# Patient Record
Sex: Female | Born: 1955 | Race: White | Hispanic: No | Smoking: Current every day smoker
Health system: Southern US, Community
[De-identification: ages and names within clinical notes are randomized; demographics above are authoritative.]

## PROBLEM LIST (undated history)

## (undated) DIAGNOSIS — C3490 Malignant neoplasm of unspecified part of unspecified bronchus or lung: Secondary | ICD-10-CM

## (undated) HISTORY — DX: Malignant neoplasm of unspecified part of unspecified bronchus or lung: C34.90

---

## 2002-11-19 ENCOUNTER — Encounter: Admission: RE | Admit: 2002-11-19 | Discharge: 2002-11-19 | Payer: Self-pay

## 2002-11-19 ENCOUNTER — Encounter: Payer: Self-pay | Admitting: Unknown Physician Specialty

## 2003-11-24 ENCOUNTER — Ambulatory Visit (HOSPITAL_COMMUNITY): Admission: RE | Admit: 2003-11-24 | Discharge: 2003-11-24 | Payer: Self-pay | Admitting: Unknown Physician Specialty

## 2003-11-27 ENCOUNTER — Encounter: Admission: RE | Admit: 2003-11-27 | Discharge: 2003-11-27 | Payer: Self-pay | Admitting: Unknown Physician Specialty

## 2004-01-08 ENCOUNTER — Ambulatory Visit (HOSPITAL_COMMUNITY): Admission: RE | Admit: 2004-01-08 | Discharge: 2004-01-09 | Payer: Self-pay | Admitting: General Surgery

## 2004-01-08 ENCOUNTER — Encounter (INDEPENDENT_AMBULATORY_CARE_PROVIDER_SITE_OTHER): Payer: Self-pay | Admitting: *Deleted

## 2004-09-16 ENCOUNTER — Encounter: Admission: RE | Admit: 2004-09-16 | Discharge: 2004-09-16 | Payer: Self-pay | Admitting: Unknown Physician Specialty

## 2004-09-21 ENCOUNTER — Encounter: Admission: RE | Admit: 2004-09-21 | Discharge: 2004-09-21 | Payer: Self-pay | Admitting: Unknown Physician Specialty

## 2004-11-17 ENCOUNTER — Ambulatory Visit (HOSPITAL_BASED_OUTPATIENT_CLINIC_OR_DEPARTMENT_OTHER): Admission: RE | Admit: 2004-11-17 | Discharge: 2004-11-17 | Payer: Self-pay | Admitting: General Surgery

## 2004-11-17 ENCOUNTER — Ambulatory Visit (HOSPITAL_COMMUNITY): Admission: RE | Admit: 2004-11-17 | Discharge: 2004-11-17 | Payer: Self-pay | Admitting: General Surgery

## 2004-11-17 ENCOUNTER — Encounter (INDEPENDENT_AMBULATORY_CARE_PROVIDER_SITE_OTHER): Payer: Self-pay | Admitting: Specialist

## 2005-06-10 ENCOUNTER — Emergency Department (HOSPITAL_COMMUNITY): Admission: EM | Admit: 2005-06-10 | Discharge: 2005-06-10 | Payer: Self-pay | Admitting: *Deleted

## 2005-12-12 ENCOUNTER — Emergency Department (HOSPITAL_COMMUNITY): Admission: EM | Admit: 2005-12-12 | Discharge: 2005-12-12 | Payer: Self-pay | Admitting: Emergency Medicine

## 2005-12-20 ENCOUNTER — Ambulatory Visit: Payer: Self-pay

## 2005-12-23 ENCOUNTER — Ambulatory Visit: Payer: Self-pay | Admitting: Cardiology

## 2006-01-04 ENCOUNTER — Ambulatory Visit: Payer: Self-pay | Admitting: Cardiology

## 2006-01-04 ENCOUNTER — Encounter: Payer: Self-pay | Admitting: Cardiology

## 2006-01-04 ENCOUNTER — Ambulatory Visit: Payer: Self-pay

## 2006-01-18 ENCOUNTER — Ambulatory Visit: Payer: Self-pay | Admitting: Cardiology

## 2006-01-24 ENCOUNTER — Ambulatory Visit (HOSPITAL_COMMUNITY): Admission: RE | Admit: 2006-01-24 | Discharge: 2006-01-24 | Payer: Self-pay | Admitting: Gastroenterology

## 2006-01-26 ENCOUNTER — Ambulatory Visit (HOSPITAL_COMMUNITY): Admission: RE | Admit: 2006-01-26 | Discharge: 2006-01-26 | Payer: Self-pay | Admitting: Gastroenterology

## 2006-01-30 ENCOUNTER — Ambulatory Visit (HOSPITAL_COMMUNITY): Admission: RE | Admit: 2006-01-30 | Discharge: 2006-01-30 | Payer: Self-pay | Admitting: Gastroenterology

## 2006-01-30 ENCOUNTER — Encounter (INDEPENDENT_AMBULATORY_CARE_PROVIDER_SITE_OTHER): Payer: Self-pay | Admitting: Specialist

## 2006-02-17 ENCOUNTER — Encounter: Admission: RE | Admit: 2006-02-17 | Discharge: 2006-02-17 | Payer: Self-pay | Admitting: Unknown Physician Specialty

## 2007-05-05 ENCOUNTER — Emergency Department (HOSPITAL_COMMUNITY): Admission: EM | Admit: 2007-05-05 | Discharge: 2007-05-05 | Payer: Self-pay | Admitting: Emergency Medicine

## 2007-07-23 ENCOUNTER — Emergency Department (HOSPITAL_COMMUNITY): Admission: EM | Admit: 2007-07-23 | Discharge: 2007-07-23 | Payer: Self-pay | Admitting: Emergency Medicine

## 2008-02-15 ENCOUNTER — Emergency Department (HOSPITAL_COMMUNITY): Admission: EM | Admit: 2008-02-15 | Discharge: 2008-02-15 | Payer: Self-pay | Admitting: Family Medicine

## 2008-05-08 ENCOUNTER — Emergency Department (HOSPITAL_COMMUNITY): Admission: EM | Admit: 2008-05-08 | Discharge: 2008-05-08 | Payer: Self-pay | Admitting: Emergency Medicine

## 2008-05-14 ENCOUNTER — Encounter: Admission: RE | Admit: 2008-05-14 | Discharge: 2008-05-14 | Payer: Self-pay | Admitting: Unknown Physician Specialty

## 2008-11-17 ENCOUNTER — Ambulatory Visit (HOSPITAL_COMMUNITY): Admission: RE | Admit: 2008-11-17 | Discharge: 2008-11-17 | Payer: Self-pay | Admitting: Unknown Physician Specialty

## 2008-11-18 ENCOUNTER — Ambulatory Visit: Payer: Self-pay | Admitting: Oncology

## 2008-11-25 ENCOUNTER — Ambulatory Visit: Payer: Self-pay | Admitting: Radiation Oncology

## 2008-11-25 ENCOUNTER — Ambulatory Visit: Payer: Self-pay | Admitting: Oncology

## 2008-12-01 ENCOUNTER — Ambulatory Visit: Payer: Self-pay | Admitting: Oncology

## 2008-12-26 ENCOUNTER — Ambulatory Visit: Payer: Self-pay | Admitting: Oncology

## 2009-01-26 ENCOUNTER — Ambulatory Visit: Payer: Self-pay | Admitting: Oncology

## 2009-02-19 ENCOUNTER — Ambulatory Visit (HOSPITAL_BASED_OUTPATIENT_CLINIC_OR_DEPARTMENT_OTHER): Admission: RE | Admit: 2009-02-19 | Discharge: 2009-02-19 | Payer: Self-pay | Admitting: General Surgery

## 2009-02-23 ENCOUNTER — Ambulatory Visit: Payer: Self-pay | Admitting: Oncology

## 2009-03-26 ENCOUNTER — Ambulatory Visit: Payer: Self-pay | Admitting: Oncology

## 2009-04-25 ENCOUNTER — Ambulatory Visit: Payer: Self-pay | Admitting: Oncology

## 2009-05-26 ENCOUNTER — Ambulatory Visit: Payer: Self-pay | Admitting: Oncology

## 2009-06-25 ENCOUNTER — Ambulatory Visit: Payer: Self-pay | Admitting: Oncology

## 2009-07-10 ENCOUNTER — Ambulatory Visit: Payer: Self-pay | Admitting: Vascular Surgery

## 2009-07-26 ENCOUNTER — Ambulatory Visit: Payer: Self-pay | Admitting: Oncology

## 2009-08-26 ENCOUNTER — Ambulatory Visit: Payer: Self-pay | Admitting: Oncology

## 2009-09-25 ENCOUNTER — Ambulatory Visit: Payer: Self-pay | Admitting: Oncology

## 2009-10-26 ENCOUNTER — Ambulatory Visit: Payer: Self-pay | Admitting: Oncology

## 2009-11-25 ENCOUNTER — Ambulatory Visit: Payer: Self-pay | Admitting: Oncology

## 2009-12-26 ENCOUNTER — Ambulatory Visit: Payer: Self-pay | Admitting: Oncology

## 2010-01-26 ENCOUNTER — Ambulatory Visit: Payer: Self-pay | Admitting: Oncology

## 2010-02-23 ENCOUNTER — Ambulatory Visit: Payer: Self-pay | Admitting: Oncology

## 2010-03-26 ENCOUNTER — Ambulatory Visit: Payer: Self-pay | Admitting: Oncology

## 2010-04-25 ENCOUNTER — Ambulatory Visit: Payer: Self-pay | Admitting: Oncology

## 2010-04-27 ENCOUNTER — Ambulatory Visit: Payer: Self-pay | Admitting: Unknown Physician Specialty

## 2010-05-26 ENCOUNTER — Ambulatory Visit: Payer: Self-pay | Admitting: Oncology

## 2010-06-25 ENCOUNTER — Ambulatory Visit: Payer: Self-pay | Admitting: Oncology

## 2010-07-26 ENCOUNTER — Ambulatory Visit: Payer: Self-pay | Admitting: Oncology

## 2010-08-09 ENCOUNTER — Ambulatory Visit: Payer: Self-pay | Admitting: Ophthalmology

## 2010-08-17 ENCOUNTER — Ambulatory Visit: Payer: Self-pay | Admitting: Ophthalmology

## 2010-08-26 ENCOUNTER — Ambulatory Visit: Payer: Self-pay | Admitting: Oncology

## 2010-09-01 ENCOUNTER — Ambulatory Visit: Payer: Self-pay | Admitting: Oncology

## 2010-09-25 ENCOUNTER — Ambulatory Visit: Payer: Self-pay | Admitting: Oncology

## 2010-10-26 ENCOUNTER — Ambulatory Visit: Payer: Self-pay | Admitting: Oncology

## 2010-11-25 ENCOUNTER — Ambulatory Visit: Payer: Self-pay | Admitting: Oncology

## 2011-01-16 ENCOUNTER — Encounter: Payer: Self-pay | Admitting: Unknown Physician Specialty

## 2011-02-07 ENCOUNTER — Ambulatory Visit: Payer: Self-pay | Admitting: Oncology

## 2011-02-14 ENCOUNTER — Ambulatory Visit: Payer: Self-pay | Admitting: Oncology

## 2011-02-24 ENCOUNTER — Ambulatory Visit: Payer: Self-pay | Admitting: Oncology

## 2011-03-27 ENCOUNTER — Ambulatory Visit: Payer: Self-pay | Admitting: Oncology

## 2011-05-10 NOTE — Op Note (Signed)
Kristine Escobar, Kristine Escobar             ACCOUNT NO.:  [id number]   MEDICAL RECORD NO.:  [id number]          PATIENT TYPE:  AMB   LOCATION:  DSC                          FACILITY:  [facility]   PHYSICIAN:  Cherylynn Ridges, M.D.    DATE OF BIRTH:  October 01, 1956   DATE OF PROCEDURE:  02/19/2009  DATE OF DISCHARGE:                               OPERATIVE REPORT   PREOPERATIVE DIAGNOSIS:  Lung cancer.   POSTOPERATIVE DIAGNOSIS:  Lung cancer.   PROCEDURE:  Placement of right subclavian Port-A-Cath.   SURGEON:  Marta Lamas. Lindie Spruce, MD   ANESTHESIA:  Monitored anesthesia care with local.   ESTIMATED BLOOD LOSS:  20 mL.   COMPLICATIONS:  No complication.   CONDITION:  Stable.   FINDINGS:  Chest x-ray is pending.   INDICATIONS FOR OPERATION:  The patient recently diagnosed with  bilateral lung cancer who comes in for Port-A-Cath placement after she  is already started chemotherapy.   OPERATION DETAILS:  The patient was taken to the operating room and  placed on table in a supine position.  After an adequate amount of IV  sedation was given, a roll was placed between the shoulder blades.  She  was prepped and draped in the usual sterile manner.  We started on the  right side.  We anesthetized the subclavian area with Xylocaine with  epinephrine.  Multiple sticks were needed to access the subclavian vein  on the left side.  It was more medial one-third to the clavicle,  however, we accessed the vein.  We did hit the artery on 2 occasions,  but there was no significant hematoma formation.   Once we hit the vein, we used Seldinger technique to pass the wire into  the vein down towards hardware.  We confirmed its presence and the  venous system using fluoroscopy.  A port site was selected in the  anterior chest wall above the right breast where we made a pocket.  This  had to be revised several times because the patient had very significant  amount of adipose tissue.  We passed a catheter through an  introducer  and dilator into the venous system down towards the right atrium.  We  confirmed its position in the right atrium using fluoroscopy.  We left  it little bit long because of the patient's pendulous breasts.  In the  supine position or in the upright position they would pull the catheter  back.  We cut the catheter to the appropriate length.  This had to be  revised by taking off 2-3 cm after its initial placement because of  redundancy in the catheter in the tunnel.  Once it was revised, we  attached it to the pocket using 3-0 Vicryl sutures through the sites on  the port itself.  We flushed with heparinized saline solution prior to  implantation, then subsequently after we got it in place.  The port is  somewhat deep in the subcutaneous tissue, however, you can feel the 3  bumps on the PowerPort adequately.  It does aspirate easily and flush  easily after it has  been in place.  We closed the subcu with 3-0 Vicryl, then the skin  closed using a running subcuticular stitch of 5-0 Vicryl.  The entrance  site of the catheter at the subclavian area was also closed with a 5-0  Vicryl.  Dermabond, Steri-Strips, and Tegaderm were used to complete the  dressing.  A chest x-ray in the recovery room is pending.      Cherylynn Ridges, M.D.  Electronically Signed     JOW/MEDQ  D:  02/19/2009  T:  02/20/2009  Job:  [id number]

## 2011-05-13 NOTE — Op Note (Signed)
Kristine Escobar, Kristine Escobar             ACCOUNT NO.:  [id number]   MEDICAL RECORD NO.:  [id number]          PATIENT TYPE:  AMB   LOCATION:  ENDO                         FACILITY:  [facility]   PHYSICIAN:  Petra Kuba, M.D.    DATE OF BIRTH:  [birth year]   DATE OF PROCEDURE:  01/30/2006  DATE OF DISCHARGE:                                 OPERATIVE REPORT   PROCEDURE:  Colonoscopy with biopsy and polypectomy.   INDICATIONS:  A patient with right upper quadrant pain due for colonic  screening. Family history of colon polyps. Consent was signed after risks,  benefits, methods, and options were thoroughly discussed in the office.   MEDICINES USED:  Fentanyl 75 mcg, Versed 8 mg.   DESCRIPTION OF PROCEDURE:  Rectal inspection is pertinent for external  hemorrhoids, small. Digital exam was negative. The video colonoscope was  inserted, easily advanced to the hepatic flexure. At that point, there was  some looping and with abdominal pressure was able to be advanced to the  cecum. On insertion some sigmoid hyperplastic appearing polyps were seen and  a rare left-sided diverticula. The cecum was identified by the appendiceal  orifice and the ileocecal valve. In fact, the scope was inserted a short  stay into the terminal ileum which was normal, photo documentation was  obtained. The scope was slowly withdrawn. The cecum, ascending and  transverse were normal as was the majority of the descending. In the distal  descending and sigmoid, a rare tiny early diverticula were seen. She also  had beginning at this junction a few left-sided hyperplastic appearing  polyps which were cold biopsied. In the more distal sigmoid, three slightly  larger polyps were seen, again appeared hyperplastic, but we went ahead and  snared them, electrocautery was applied and we suctioned them through the  scope and collected them in the trap and put them in a separate container.  Once back in the rectum, anorectal pull-through  and retroflexion confirmed  some small hemorrhoids, no other abnormalities were seen. The scope was  straightened and readvanced a short ways up the left side of the colon, air  was suctioned, scope removed. The patient tolerated the procedure well.  There was no obvious or immediate complication.   ENDOSCOPIC DIAGNOSES:  1.  Internal and external small hemorrhoids.  2.  Rare left-sided early diverticula.  3.  Multiple left-sided hyperplastic appearing polyps cold biopsied.  4.  Three rectal and distal sigmoid small polyps snared and put in a      separate container.  5.  Otherwise within normal limits to the cecum and the terminal.   PLAN:  Await pathology to determine future colonic screening. Continue  workup with an EGD.           ______________________________  Petra Kuba, M.D.     MEM/MEDQ  D:  01/30/2006  T:  01/30/2006  Job:  [id number]

## 2011-05-13 NOTE — Op Note (Signed)
Kristine Escobar, [name]             ACCOUNT NO.:  [id number]   MEDICAL RECORD NO.:  [id number]          PATIENT TYPE:  AMB   LOCATION:  ENDO                         FACILITY:  [facility]   PHYSICIAN:  Petra Kuba, M.D.    DATE OF BIRTH:  [birth year]   DATE OF PROCEDURE:  01/30/2006  DATE OF DISCHARGE:                                 OPERATIVE REPORT   PROCEDURE:  Esophagogastroduodenoscopy.   INDICATIONS:  Right upper quadrant pain, nausea and vomiting, nondiagnostic  colonoscopy and CAT scan.   Consent was signed after risks, benefits, methods, options thoroughly  discussed in the office.   Additional medicines for this procedure:  Fentanyl 25 mcg, Versed 2 mg.   PROCEDURE:  The video endoscope was inserted by direct vision.  The  esophagus was normal.  She did have a small to medium sized hiatal hernia.  The scope passed into the stomach.  She had some difficulty holding air, but  we were able to advance through a normal pylorus into a normal duodenal  bulb, around the C loop to a normal second portion of the duodenum.  The  scope was withdrawn back into the bulb, and a good look there ruled out  ulcers in all locations.  The scope was withdrawn back to the stomach.  She  did have a minimal amount of antritis.  The stomach was evaluated on  straight and retroflexed visualization.  She had some difficulty holding  air, which made complete evaluation difficult but on quick evaluation, the  cardia, fundus, angularis, lesser and greater curves were all normal except  for the hiatal hernia being confirmed in the cardia.  The scope was slowly  withdrawn, again, the esophagus was normal, except for the hiatal hernia.  No additional findings were seen.  The scope was removed.  The patient  tolerated the procedure fairly well.  There was no obvious immediate  complication.   ENDOSCOPIC DIAGNOSES:  1.  Small to medium sized hiatal hernia.  2.  Minimal antritis.  3.  Otherwise normal  esophagogastroduodenoscopy with some difficulty holding      air, making complete visualization of the stomach difficult.   PLAN:  Will upgrade her acid blockers to pump inhibitors.  Consider MRCP or  a small bowel follow-through, possibly even a laparoscope and lysis of  adhesions.  Will touch base with the biopsy, see how her pain is doing, and  decide how to proceed.           ______________________________  Petra Kuba, M.D.     MEM/MEDQ  D:  01/30/2006  T:  01/30/2006  Job:  [id number]

## 2011-05-13 NOTE — Op Note (Signed)
NAMEGEORGIAN, Kristine Escobar                       ACCOUNT NO.:  [id number]   MEDICAL RECORD NO.:  [id number]                   PATIENT TYPE:  OIB   LOCATION:  6703                                 FACILITY:  [facility]   PHYSICIAN:  Leonie Man, M.D.                DATE OF BIRTH:  Jan 29, 1956   DATE OF PROCEDURE:  01/08/2004  DATE OF DISCHARGE:                                 OPERATIVE REPORT   PREOPERATIVE DIAGNOSIS:  Chronic calculus cholecystitis.   POSTOPERATIVE DIAGNOSIS:  Chronic calculus cholecystitis.   PROCEDURE:  Laparoscopic cholecystectomy with intraoperative cholangiogram.   SURGEON:  Leonie Man, M.D.   ASSISTANT:  Anselm Pancoast. Zachery Dakins, M.D.   ANESTHESIA:  General.   INDICATIONS FOR PROCEDURE:  This patient is a 55 year old woman presenting  with right-sided abdominal pain associated with nausea and vomiting, dating  back to approximately a month ago.  Evaluated and noted on abdominal  ultrasound to have a contracted gallbladder packed with stones.  There was  no associated ductal dilatation.  Normal liver function.  She comes to the  operating room now after the risks and potential benefits of surgery have  been fully discussed and she gives consent.   DESCRIPTION OF PROCEDURE:  Following the induction of satisfactory general  anesthesia, the patient is positioned supinely and the abdomen is prepped  and draped routinely.  Open laparoscopy created at the umbilicus and the  insertion of the Hasson cannula and insufflation of peritoneal cavity to 14  mmHg pressure.  The camera is inserted with visualization of the entire  abdomen was carried out.  The anterior gastric wall was noted.  There was  some tethering of the duodenum up to the ampulla of the gallbladder.  The  gallbladder was chronically scarred and contracted.  None of the large or  small intestines viewed appeared to be abnormal.  Under direct vision,  epigastric and lateral ports were placed.  The  gallbladder was grasped and  retracted cephalad and dissection carried down to the region of the ampulla  of the gallbladder taking down multiple adhesions below the gallbladder.  The cystic duct and cystic artery were then isolated and traced up to their  entry into the gallbladder wall and the gallbladder.  The cystic artery was  doubly clipped and transected.  The cystic duct clipped proximally and  opened.  I used a Cook catheter, passing it through the abdominal wall into  the abdomen, placed it in the cystic duct, and injected 1/2 strength Hypaque  dye into the biliary system under fluoroscopic control.  The resulting  cholangiogram showed prompt flow of contrast into the duodenum with normal  distal common bile duct tapering, no filling defects within the common bile  duct or the upper radicals which were visualized well.  The cystic duct  catheter was then removed and the cystic duct doubly clipped and transected.  The gallbladder was then dissected free from  the liver bed using  electrocautery and maintaining hemostasis throughout the entire course of  the dissection.  The liver bed was then checked for hemostasis and  additional bleeding points treated with electrocautery.  The gallbladder was  placed in an Endopouch and with the camera in the epigastric port, the  gallbladder was retrieved through the umbilical port without difficulty.  The right upper quadrant was thoroughly irrigated with normal saline and  aspirated.  The trocars were removed under direct vision and the incisions  closed in layers as follows after the pneumoperitoneum was completely  evacuated.   The umbilical wound in two layers with 0 Vicryl and 4-0 Monocryl.  The  epigastric and lateral flank wounds closed with 4-0 Monocryl suture.  All  wounds were reinforced with Steri-Strips.  Sterile dressings applied.  Anesthetic reversed.  The patient removed from the operating room to the  recovery room in stable  condition.  She tolerated the procedure well.                                               Leonie Man, M.D.    PB/MEDQ  D:  01/08/2004  T:  01/08/2004  Job:  [id number]

## 2011-05-13 NOTE — Op Note (Signed)
Kristine Escobar, Kristine Escobar             ACCOUNT NO.:  [id number]   MEDICAL RECORD NO.:  [id number]          PATIENT TYPE:  AMB   LOCATION:  DSC                          FACILITY:  [facility]   PHYSICIAN:  Leonie Man, M.D.   DATE OF BIRTH:  May 16, 1956   DATE OF PROCEDURE:  11/17/2004  DATE OF DISCHARGE:                                 OPERATIVE REPORT   PREOPERATIVE DIAGNOSIS:  1.  Mass right upper outer quadrant of the breast.  2.  Sebaceous cyst nipple right breast.   POSTOPERATIVE DIAGNOSIS:  1.  Mass (lipoma) right breast upper outer quadrant.  2.  Sebaceous cyst nipple right breast.   PROCEDURE:  Excision of mass of the right breast in the right upper outer  quadrant and excision of sebaceous cyst of the nipple of the right breast.   SURGEON:  Leonie Man, M.D.   ASSISTANT:  Nurse.   ANESTHESIA:  General.   INDICATIONS FOR PROCEDURE:  The patient is a 54 year old female with  recurrent infections in the nipple of the breast.  These have been treated  with antibiotics and now under control to the point where she may have it  excised.  On further examination of her breast, there is a mass in the upper  outer quadrant of the breast which is technically a fibroadenoma.  The  patient comes to the operating room after the risks and potential benefits  of surgery have been discussed, all questions answered, and consent  obtained.   DESCRIPTION OF PROCEDURE:  Following the induction of satisfactory general  anesthesia, the patient is positioned supinely.  The right breast prepped  and draped to be included in a sterile operative field.  The upper outer  quadrant mass is first approached with a paracircumareolar incision deepened  through the skin and subcutaneous tissue, and carried down to the mass.  The  mass was excised in its entirety.  It is clinically a lipoma.  Hemostasis  was assured with electrocautery.  The subcutaneous tissue is closed with  interrupted 3-0 Vicryl  sutures and the skin closed with a 5-0 Monocryl  suture.   Attention was then turned to the region of the nipple where in the 6 o'clock  axis of the nipple there is a mass which is consistent with a healed  epidermoid cyst.  This is encircled with an elliptical incision, deepened  through the skin and subcutaneous tissue, getting all the way onto the cyst  and removing it in its entirety.  The nipple is reconstructed using  interrupted 3-0 Vicryl sutures in the subcutaneous tissues and a running 5-0  Monocryl suture to close the skin.  Needle, sponge, and instrument counts correct.  The wounds were reinforced  with Steri-Strips.  Sterile dressings applied.  Anesthetic reversed.  The  patient removed from the operating room to the recovery room in stable  condition.  She tolerated the procedure well.      Patr   PB/MEDQ  D:  11/17/2004  T:  11/17/2004  Job:  [id number]

## 2011-05-13 NOTE — Discharge Summary (Signed)
Kristine Escobar, Kristine Escobar                       ACCOUNT NO.:  [id number]   MEDICAL RECORD NO.:  [id number]                   PATIENT TYPE:  OIB   LOCATION:  6703                                 FACILITY:  [facility]   PHYSICIAN:  Leonie Man, M.D.                DATE OF BIRTH:  May 13, 1956   DATE OF ADMISSION:  01/08/2004  DATE OF DISCHARGE:  01/09/2004                                 DISCHARGE SUMMARY   ADMISSION DIAGNOSIS:  Chronic calculus cholecystitis.   DISCHARGE DIAGNOSIS:  Chronic calculus cholecystitis.   HOSPITAL PROCEDURES:  Laparoscopic cholecystectomy with intraoperative  cholangiogram.   COMPLICATIONS:  None.   CONDITION ON DISCHARGE:  Improved.   HOME MEDICATIONS:  Vicodin 5/500 1-2 q.4h. p.r.n. pain.   ACTIVITY:  As tolerated.   DIET:  Unrestricted.   Patient is advised to continue her usual medications at home, which include  Accupril 40 mg daily, Norvasc 10 mg daily, Avandia 4 mg daily, Zoloft 50 mg  daily.   She is also instructed to stop smoking, as her chest x-rays showed some  incipient pulmonary disease.   Followup will be in the office in approximately two weeks.                                                Leonie Man, M.D.    PB/MEDQ  D:  01/09/2004  T:  01/09/2004  Job:  [id number]

## 2011-05-16 ENCOUNTER — Ambulatory Visit: Payer: Self-pay | Admitting: Oncology

## 2011-05-27 ENCOUNTER — Ambulatory Visit: Payer: Self-pay | Admitting: Oncology

## 2011-06-26 ENCOUNTER — Ambulatory Visit: Payer: Self-pay | Admitting: Oncology

## 2011-09-08 ENCOUNTER — Ambulatory Visit: Payer: Self-pay | Admitting: Oncology

## 2011-09-13 ENCOUNTER — Ambulatory Visit: Payer: Self-pay | Admitting: Oncology

## 2011-09-22 ENCOUNTER — Ambulatory Visit: Payer: Self-pay | Admitting: Unknown Physician Specialty

## 2011-09-26 ENCOUNTER — Ambulatory Visit: Payer: Self-pay | Admitting: Oncology

## 2011-10-27 ENCOUNTER — Ambulatory Visit: Payer: Self-pay | Admitting: Oncology

## 2011-11-22 ENCOUNTER — Ambulatory Visit: Payer: Self-pay | Admitting: Oncology

## 2011-11-26 ENCOUNTER — Ambulatory Visit: Payer: Self-pay | Admitting: Oncology

## 2011-12-27 ENCOUNTER — Ambulatory Visit: Payer: Self-pay | Admitting: Oncology

## 2011-12-29 LAB — CBC CANCER CENTER
Basophil #: 0.3 x10 3/mm — ABNORMAL HIGH (ref 0.0–0.1)
Eosinophil #: 0.1 x10 3/mm (ref 0.0–0.7)
Eosinophil %: 1 %
HCT: 42.5 % (ref 35.0–47.0)
Lymphocyte #: 2 x10 3/mm (ref 1.0–3.6)
Lymphocyte %: 29 %
MCH: 34 pg (ref 26.0–34.0)
MCHC: 33.5 g/dL (ref 32.0–36.0)
MCV: 102 fL — ABNORMAL HIGH (ref 80–100)
Monocyte #: 0.7 x10 3/mm (ref 0.0–0.7)
Neutrophil #: 3.8 x10 3/mm (ref 1.4–6.5)
Platelet: 182 x10 3/mm (ref 150–440)
RDW: 20.8 % — ABNORMAL HIGH (ref 11.5–14.5)

## 2011-12-29 LAB — COMPREHENSIVE METABOLIC PANEL
Alkaline Phosphatase: 95 U/L (ref 50–136)
Bilirubin,Total: 0.3 mg/dL (ref 0.2–1.0)
Calcium, Total: 9.3 mg/dL (ref 8.5–10.1)
Chloride: 98 mmol/L (ref 98–107)
Co2: 36 mmol/L — ABNORMAL HIGH (ref 21–32)
Creatinine: 2.16 mg/dL — ABNORMAL HIGH (ref 0.60–1.30)
EGFR (African American): 30 — ABNORMAL LOW
Glucose: 158 mg/dL — ABNORMAL HIGH (ref 65–99)
SGOT(AST): 23 U/L (ref 15–37)
SGPT (ALT): 18 U/L
Total Protein: 7.1 g/dL (ref 6.4–8.2)

## 2012-01-05 LAB — HEMOGLOBIN A1C: Hemoglobin A1C: 7 % — ABNORMAL HIGH (ref 4.2–6.3)

## 2012-01-05 LAB — CBC CANCER CENTER
Basophil #: 0 x10 3/mm (ref 0.0–0.1)
Eosinophil #: 0.1 x10 3/mm (ref 0.0–0.7)
Eosinophil %: 1 %
HGB: 15.5 g/dL (ref 12.0–16.0)
Lymphocyte #: 1.7 x10 3/mm (ref 1.0–3.6)
Lymphocyte %: 24.4 %
MCHC: 33.6 g/dL (ref 32.0–36.0)
MCV: 101 fL — ABNORMAL HIGH (ref 80–100)
Monocyte #: 0.6 x10 3/mm (ref 0.0–0.7)
Monocyte %: 9.2 %
Neutrophil %: 64.8 %
Platelet: 243 x10 3/mm (ref 150–440)
RBC: 4.6 10*6/uL (ref 3.80–5.20)
RDW: 19.6 % — ABNORMAL HIGH (ref 11.5–14.5)
WBC: 6.8 x10 3/mm (ref 3.6–11.0)

## 2012-01-12 LAB — CBC CANCER CENTER
Basophil #: 0.1 x10 3/mm (ref 0.0–0.1)
Eosinophil #: 0 x10 3/mm (ref 0.0–0.7)
Eosinophil %: 0.5 %
HGB: 15.4 g/dL (ref 12.0–16.0)
Lymphocyte #: 2.3 x10 3/mm (ref 1.0–3.6)
MCH: 33.8 pg (ref 26.0–34.0)
MCHC: 33.8 g/dL (ref 32.0–36.0)
Monocyte #: 0.7 x10 3/mm (ref 0.0–0.7)
Neutrophil %: 65.6 %
Platelet: 225 x10 3/mm (ref 150–440)
RDW: 17.9 % — ABNORMAL HIGH (ref 11.5–14.5)

## 2012-01-12 LAB — COMPREHENSIVE METABOLIC PANEL
BUN: 32 mg/dL — ABNORMAL HIGH (ref 7–18)
Bilirubin,Total: 0.4 mg/dL (ref 0.2–1.0)
Calcium, Total: 9.6 mg/dL (ref 8.5–10.1)
Co2: 32 mmol/L (ref 21–32)
Creatinine: 1.89 mg/dL — ABNORMAL HIGH (ref 0.60–1.30)
EGFR (African American): 35 — ABNORMAL LOW
EGFR (Non-African Amer.): 29 — ABNORMAL LOW
Glucose: 121 mg/dL — ABNORMAL HIGH (ref 65–99)
Osmolality: 284 (ref 275–301)
Potassium: 4.4 mmol/L (ref 3.5–5.1)
Sodium: 138 mmol/L (ref 136–145)

## 2012-01-19 LAB — CBC CANCER CENTER
Basophil #: 0 x10 3/mm (ref 0.0–0.1)
Basophil %: 0.4 %
Eosinophil #: 0.1 x10 3/mm (ref 0.0–0.7)
Eosinophil %: 1.3 %
HCT: 45.9 % (ref 35.0–47.0)
Lymphocyte %: 28.2 %
MCH: 33.9 pg (ref 26.0–34.0)
MCHC: 34 g/dL (ref 32.0–36.0)
MCV: 100 fL (ref 80–100)
Monocyte #: 0.6 x10 3/mm (ref 0.0–0.7)
Monocyte %: 7.7 %
Neutrophil #: 5.2 x10 3/mm (ref 1.4–6.5)
Neutrophil %: 62.4 %
Platelet: 204 x10 3/mm (ref 150–440)
RDW: 17.9 % — ABNORMAL HIGH (ref 11.5–14.5)

## 2012-01-20 ENCOUNTER — Ambulatory Visit: Payer: Self-pay | Admitting: Oncology

## 2012-01-26 LAB — CBC CANCER CENTER
Eosinophil #: 0.1 x10 3/mm (ref 0.0–0.7)
Eosinophil %: 1.3 %
Lymphocyte %: 21.7 %
MCH: 33.8 pg (ref 26.0–34.0)
Monocyte #: 0.6 x10 3/mm (ref 0.0–0.7)
Neutrophil %: 69.2 %
Platelet: 203 x10 3/mm (ref 150–440)
RBC: 4.65 10*6/uL (ref 3.80–5.20)
WBC: 8.3 x10 3/mm (ref 3.6–11.0)

## 2012-01-27 ENCOUNTER — Ambulatory Visit: Payer: Self-pay | Admitting: Oncology

## 2012-02-23 ENCOUNTER — Inpatient Hospital Stay: Payer: Self-pay | Admitting: Oncology

## 2012-02-23 LAB — CBC WITH DIFFERENTIAL/PLATELET
Basophil %: 1 %
Eosinophil #: 0 10*3/uL (ref 0.0–0.7)
Eosinophil %: 0.4 %
HCT: 44.9 % (ref 35.0–47.0)
HGB: 15 g/dL (ref 12.0–16.0)
Lymphocyte #: 1.4 10*3/uL (ref 1.0–3.6)
MCH: 32.1 pg (ref 26.0–34.0)
MCHC: 33.3 g/dL (ref 32.0–36.0)
MCV: 96 fL (ref 80–100)
Monocyte #: 0.9 10*3/uL — ABNORMAL HIGH (ref 0.0–0.7)
Neutrophil #: 4.5 10*3/uL (ref 1.4–6.5)
Neutrophil %: 65.9 %
RBC: 4.66 10*6/uL (ref 3.80–5.20)

## 2012-02-23 LAB — COMPREHENSIVE METABOLIC PANEL
Albumin: 3.1 g/dL — ABNORMAL LOW (ref 3.4–5.0)
Alkaline Phosphatase: 78 U/L (ref 50–136)
Bilirubin,Total: 0.4 mg/dL (ref 0.2–1.0)
Chloride: 96 mmol/L — ABNORMAL LOW (ref 98–107)
Creatinine: 1.54 mg/dL — ABNORMAL HIGH (ref 0.60–1.30)
EGFR (African American): 45 — ABNORMAL LOW
EGFR (Non-African Amer.): 37 — ABNORMAL LOW
Osmolality: 282 (ref 275–301)
SGOT(AST): 20 U/L (ref 15–37)
SGPT (ALT): 17 U/L
Total Protein: 7.6 g/dL (ref 6.4–8.2)

## 2012-02-24 ENCOUNTER — Ambulatory Visit: Payer: Self-pay | Admitting: Oncology

## 2012-02-24 LAB — CBC WITH DIFFERENTIAL/PLATELET
Basophil %: 0.2 %
Eosinophil %: 0 %
HCT: 47.6 % — ABNORMAL HIGH (ref 35.0–47.0)
HGB: 15.9 g/dL (ref 12.0–16.0)
Lymphocyte %: 18.5 %
MCH: 32.1 pg (ref 26.0–34.0)
Monocyte #: 0.1 10*3/uL (ref 0.0–0.7)
Monocyte %: 3.2 %
Neutrophil %: 78.1 %
Platelet: 143 10*3/uL — ABNORMAL LOW (ref 150–440)
RBC: 4.95 10*6/uL (ref 3.80–5.20)
WBC: 3.1 10*3/uL — ABNORMAL LOW (ref 3.6–11.0)

## 2012-02-24 LAB — COMPREHENSIVE METABOLIC PANEL
Albumin: 3.1 g/dL — ABNORMAL LOW (ref 3.4–5.0)
Alkaline Phosphatase: 83 U/L (ref 50–136)
Anion Gap: 10 (ref 7–16)
Calcium, Total: 9.5 mg/dL (ref 8.5–10.1)
Co2: 29 mmol/L (ref 21–32)
Osmolality: 285 (ref 275–301)
Potassium: 4.2 mmol/L (ref 3.5–5.1)
SGPT (ALT): 17 U/L
Sodium: 137 mmol/L (ref 136–145)
Total Protein: 8 g/dL (ref 6.4–8.2)

## 2012-02-27 LAB — BASIC METABOLIC PANEL
Calcium, Total: 9.5 mg/dL (ref 8.5–10.1)
Chloride: 98 mmol/L (ref 98–107)
Co2: 34 mmol/L — ABNORMAL HIGH (ref 21–32)
EGFR (Non-African Amer.): 46 — ABNORMAL LOW
Glucose: 110 mg/dL — ABNORMAL HIGH (ref 65–99)
Osmolality: 288 (ref 275–301)
Potassium: 4.6 mmol/L (ref 3.5–5.1)
Sodium: 140 mmol/L (ref 136–145)

## 2012-03-26 ENCOUNTER — Ambulatory Visit: Payer: Self-pay | Admitting: Oncology

## 2012-04-05 LAB — CBC CANCER CENTER
Basophil #: 0 x10 3/mm (ref 0.0–0.1)
Eosinophil #: 0.1 x10 3/mm (ref 0.0–0.7)
Eosinophil %: 1.4 %
HCT: 40.3 % (ref 35.0–47.0)
HGB: 13.5 g/dL (ref 12.0–16.0)
Lymphocyte %: 25.7 %
MCH: 30.9 pg (ref 26.0–34.0)
MCHC: 33.6 g/dL (ref 32.0–36.0)
MCV: 92 fL (ref 80–100)
Platelet: 158 x10 3/mm (ref 150–440)
WBC: 6.7 x10 3/mm (ref 3.6–11.0)

## 2012-04-05 LAB — COMPREHENSIVE METABOLIC PANEL
Anion Gap: 7 (ref 7–16)
BUN: 21 mg/dL — ABNORMAL HIGH (ref 7–18)
Bilirubin,Total: 0.3 mg/dL (ref 0.2–1.0)
Calcium, Total: 9.3 mg/dL (ref 8.5–10.1)
Co2: 31 mmol/L (ref 21–32)
EGFR (African American): 51 — ABNORMAL LOW
EGFR (Non-African Amer.): 44 — ABNORMAL LOW
Glucose: 162 mg/dL — ABNORMAL HIGH (ref 65–99)
Osmolality: 282 (ref 275–301)
Potassium: 4.1 mmol/L (ref 3.5–5.1)
Sodium: 138 mmol/L (ref 136–145)

## 2012-04-25 ENCOUNTER — Ambulatory Visit: Payer: Self-pay | Admitting: Oncology

## 2012-05-26 ENCOUNTER — Ambulatory Visit: Payer: Self-pay | Admitting: Oncology

## 2012-08-08 ENCOUNTER — Inpatient Hospital Stay: Payer: Self-pay | Admitting: Internal Medicine

## 2012-08-08 LAB — CBC
HCT: 47 % (ref 35.0–47.0)
HGB: 16.1 g/dL — ABNORMAL HIGH (ref 12.0–16.0)
MCH: 32.1 pg (ref 26.0–34.0)
MCHC: 34.2 g/dL (ref 32.0–36.0)
MCV: 94 fL (ref 80–100)
RDW: 15.7 % — ABNORMAL HIGH (ref 11.5–14.5)

## 2012-08-08 LAB — COMPREHENSIVE METABOLIC PANEL
Bilirubin,Total: 0.5 mg/dL (ref 0.2–1.0)
Calcium, Total: 9.2 mg/dL (ref 8.5–10.1)
Chloride: 97 mmol/L — ABNORMAL LOW (ref 98–107)
Co2: 32 mmol/L (ref 21–32)
Creatinine: 1.59 mg/dL — ABNORMAL HIGH (ref 0.60–1.30)
EGFR (African American): 42 — ABNORMAL LOW
EGFR (Non-African Amer.): 36 — ABNORMAL LOW
Sodium: 134 mmol/L — ABNORMAL LOW (ref 136–145)

## 2012-08-08 LAB — URINALYSIS, COMPLETE
Bilirubin,UR: NEGATIVE
Blood: NEGATIVE
Glucose,UR: NEGATIVE mg/dL (ref 0–75)
Specific Gravity: 1.024 (ref 1.003–1.030)
Squamous Epithelial: 13

## 2012-08-08 LAB — TROPONIN I: Troponin-I: <0.02 ng/mL

## 2012-08-13 LAB — CULTURE, BLOOD (SINGLE)

## 2012-08-14 ENCOUNTER — Ambulatory Visit: Payer: Self-pay | Admitting: Oncology

## 2012-09-03 ENCOUNTER — Ambulatory Visit: Payer: Self-pay | Admitting: Oncology

## 2012-09-03 LAB — COMPREHENSIVE METABOLIC PANEL WITH GFR
Albumin: 3.1 g/dL — ABNORMAL LOW (ref 3.4–5.0)
Alkaline Phosphatase: 97 U/L (ref 50–136)
Anion Gap: 2 — ABNORMAL LOW (ref 7–16)
BUN: 18 mg/dL (ref 7–18)
Bilirubin,Total: 0.4 mg/dL (ref 0.2–1.0)
Calcium, Total: 9.1 mg/dL (ref 8.5–10.1)
Chloride: 99 mmol/L (ref 98–107)
Co2: 37 mmol/L — ABNORMAL HIGH (ref 21–32)
Creatinine: 1.61 mg/dL — ABNORMAL HIGH (ref 0.60–1.30)
EGFR (African American): 41 — ABNORMAL LOW
EGFR (Non-African Amer.): 35 — ABNORMAL LOW
Glucose: 193 mg/dL — ABNORMAL HIGH (ref 65–99)
Osmolality: 283 (ref 275–301)
Potassium: 4 mmol/L (ref 3.5–5.1)
SGOT(AST): 13 U/L — ABNORMAL LOW (ref 15–37)
SGPT (ALT): 20 U/L (ref 12–78)
Sodium: 138 mmol/L (ref 136–145)
Total Protein: 7.2 g/dL (ref 6.4–8.2)

## 2012-09-03 LAB — CBC CANCER CENTER
Basophil #: 0.1 x10 3/mm (ref 0.0–0.1)
Eosinophil %: 1.9 %
Lymphocyte #: 1.7 x10 3/mm (ref 1.0–3.6)
Lymphocyte %: 24.8 %
MCV: 96 fL (ref 80–100)
Monocyte %: 7 %
Neutrophil %: 65.5 %
Platelet: 171 x10 3/mm (ref 150–440)
RBC: 4.82 10*6/uL (ref 3.80–5.20)
RDW: 16.7 % — ABNORMAL HIGH (ref 11.5–14.5)
WBC: 7 x10 3/mm (ref 3.6–11.0)

## 2012-09-10 ENCOUNTER — Ambulatory Visit: Payer: Self-pay | Admitting: Oncology

## 2012-09-19 LAB — CBC CANCER CENTER
Basophil %: 1.2 %
Eosinophil #: 0.1 x10 3/mm (ref 0.0–0.7)
HCT: 47 % (ref 35.0–47.0)
HGB: 15.3 g/dL (ref 12.0–16.0)
Lymphocyte #: 1.6 x10 3/mm (ref 1.0–3.6)
Lymphocyte %: 16.9 %
MCH: 30.9 pg (ref 26.0–34.0)
MCHC: 32.6 g/dL (ref 32.0–36.0)
MCV: 95 fL (ref 80–100)
Monocyte #: 0.7 x10 3/mm (ref 0.2–0.9)
Monocyte %: 7.5 %
Neutrophil #: 7.1 x10 3/mm — ABNORMAL HIGH (ref 1.4–6.5)
Platelet: 199 x10 3/mm (ref 150–440)
RDW: 17.3 % — ABNORMAL HIGH (ref 11.5–14.5)

## 2012-09-19 LAB — COMPREHENSIVE METABOLIC PANEL
Albumin: 3.2 g/dL — ABNORMAL LOW (ref 3.4–5.0)
Alkaline Phosphatase: 103 U/L (ref 50–136)
Anion Gap: 7 (ref 7–16)
BUN: 15 mg/dL (ref 7–18)
Bilirubin,Total: 0.4 mg/dL (ref 0.2–1.0)
Chloride: 98 mmol/L (ref 98–107)
Creatinine: 1.7 mg/dL — ABNORMAL HIGH (ref 0.60–1.30)
EGFR (African American): 38 — ABNORMAL LOW
EGFR (Non-African Amer.): 33 — ABNORMAL LOW
Glucose: 200 mg/dL — ABNORMAL HIGH (ref 65–99)
Osmolality: 280 (ref 275–301)
Potassium: 3.9 mmol/L (ref 3.5–5.1)
Sodium: 137 mmol/L (ref 136–145)
Total Protein: 7.4 g/dL (ref 6.4–8.2)

## 2012-09-25 ENCOUNTER — Ambulatory Visit: Payer: Self-pay | Admitting: Oncology

## 2012-09-26 LAB — CBC CANCER CENTER
Basophil %: 0.4 %
Eosinophil #: 0.1 x10 3/mm (ref 0.0–0.7)
Eosinophil %: 1.3 %
HGB: 14 g/dL (ref 12.0–16.0)
Lymphocyte #: 1.3 x10 3/mm (ref 1.0–3.6)
Monocyte #: 0.3 x10 3/mm (ref 0.2–0.9)
Neutrophil #: 2.9 x10 3/mm (ref 1.4–6.5)
Neutrophil %: 63.1 %
RBC: 4.53 10*6/uL (ref 3.80–5.20)
RDW: 16.6 % — ABNORMAL HIGH (ref 11.5–14.5)
WBC: 4.6 x10 3/mm (ref 3.6–11.0)

## 2012-10-03 LAB — CBC CANCER CENTER
Basophil #: 0 x10 3/mm (ref 0.0–0.1)
Basophil %: 0.7 %
Eosinophil #: 0 x10 3/mm (ref 0.0–0.7)
HCT: 42.5 % (ref 35.0–47.0)
HGB: 14.1 g/dL (ref 12.0–16.0)
Lymphocyte %: 55.8 %
MCH: 31 pg (ref 26.0–34.0)
MCHC: 33.2 g/dL (ref 32.0–36.0)
Monocyte %: 6.9 %
Neutrophil #: 0.9 x10 3/mm — ABNORMAL LOW (ref 1.4–6.5)
Neutrophil %: 36.3 %
Platelet: 70 x10 3/mm — ABNORMAL LOW (ref 150–440)
RBC: 4.55 10*6/uL (ref 3.80–5.20)
RDW: 16.5 % — ABNORMAL HIGH (ref 11.5–14.5)
WBC: 2.4 x10 3/mm — ABNORMAL LOW (ref 3.6–11.0)

## 2012-10-10 LAB — CBC CANCER CENTER
Basophil #: 0 x10 3/mm (ref 0.0–0.1)
Eosinophil #: 0.1 x10 3/mm (ref 0.0–0.7)
Eosinophil %: 1 %
HCT: 43.7 % (ref 35.0–47.0)
Lymphocyte #: 1.7 x10 3/mm (ref 1.0–3.6)
MCH: 31.4 pg (ref 26.0–34.0)
MCHC: 33 g/dL (ref 32.0–36.0)
MCV: 95 fL (ref 80–100)
Monocyte #: 0.7 x10 3/mm (ref 0.2–0.9)
Neutrophil #: 2.9 x10 3/mm (ref 1.4–6.5)
Neutrophil %: 54.1 %
Platelet: 274 x10 3/mm (ref 150–440)
RBC: 4.59 10*6/uL (ref 3.80–5.20)
RDW: 17.7 % — ABNORMAL HIGH (ref 11.5–14.5)

## 2012-10-17 LAB — COMPREHENSIVE METABOLIC PANEL
Albumin: 3.2 g/dL — ABNORMAL LOW (ref 3.4–5.0)
BUN: 18 mg/dL (ref 7–18)
Bilirubin,Total: 0.3 mg/dL (ref 0.2–1.0)
Creatinine: 1.43 mg/dL — ABNORMAL HIGH (ref 0.60–1.30)
EGFR (Non-African Amer.): 41 — ABNORMAL LOW
Glucose: 293 mg/dL — ABNORMAL HIGH (ref 65–99)
Potassium: 3.8 mmol/L (ref 3.5–5.1)
SGOT(AST): 13 U/L — ABNORMAL LOW (ref 15–37)
SGPT (ALT): 28 U/L (ref 12–78)
Total Protein: 7 g/dL (ref 6.4–8.2)

## 2012-10-17 LAB — CBC CANCER CENTER
Basophil %: 0.7 %
Eosinophil #: 0.1 x10 3/mm (ref 0.0–0.7)
Eosinophil %: 1.6 %
Monocyte #: 0.7 x10 3/mm (ref 0.2–0.9)
Monocyte %: 11.8 %
Neutrophil #: 3.4 x10 3/mm (ref 1.4–6.5)
Neutrophil %: 57.6 %
Platelet: 230 x10 3/mm (ref 150–440)
RBC: 4.28 10*6/uL (ref 3.80–5.20)
WBC: 5.9 x10 3/mm (ref 3.6–11.0)

## 2012-10-24 ENCOUNTER — Ambulatory Visit: Payer: Self-pay | Admitting: Unknown Physician Specialty

## 2012-10-26 ENCOUNTER — Ambulatory Visit: Payer: Self-pay | Admitting: Oncology

## 2012-10-31 LAB — CBC CANCER CENTER
Eosinophil: 2 %
MCH: 32.7 pg (ref 26.0–34.0)
MCV: 99 fL (ref 80–100)
Metamyelocyte: 1 %
Monocytes: 5 %
Platelet: 117 x10 3/mm — ABNORMAL LOW (ref 150–440)
RBC: 3.97 10*6/uL (ref 3.80–5.20)
RDW: 20 % — ABNORMAL HIGH (ref 11.5–14.5)

## 2012-11-25 ENCOUNTER — Ambulatory Visit: Payer: Self-pay | Admitting: Oncology

## 2012-11-28 LAB — CBC CANCER CENTER
Basophil %: 0.9 %
Eosinophil #: 0.2 x10 3/mm (ref 0.0–0.7)
Eosinophil %: 1.9 %
MCH: 33.7 pg (ref 26.0–34.0)
MCHC: 34 g/dL (ref 32.0–36.0)
Monocyte #: 0.7 x10 3/mm (ref 0.2–0.9)
Monocyte %: 8.4 %
Neutrophil %: 68.1 %
Platelet: 160 x10 3/mm (ref 150–440)
RBC: 4.6 10*6/uL (ref 3.80–5.20)

## 2012-11-28 LAB — COMPREHENSIVE METABOLIC PANEL
Albumin: 3.2 g/dL — ABNORMAL LOW (ref 3.4–5.0)
Alkaline Phosphatase: 103 U/L (ref 50–136)
BUN: 23 mg/dL — ABNORMAL HIGH (ref 7–18)
Calcium, Total: 9.5 mg/dL (ref 8.5–10.1)
Co2: 31 mmol/L (ref 21–32)
Creatinine: 1.73 mg/dL — ABNORMAL HIGH (ref 0.60–1.30)
EGFR (Non-African Amer.): 32 — ABNORMAL LOW
Glucose: 234 mg/dL — ABNORMAL HIGH (ref 65–99)
Osmolality: 289 (ref 275–301)
Potassium: 3.8 mmol/L (ref 3.5–5.1)
SGPT (ALT): 29 U/L (ref 12–78)
Sodium: 139 mmol/L (ref 136–145)
Total Protein: 7.2 g/dL (ref 6.4–8.2)

## 2012-12-26 ENCOUNTER — Ambulatory Visit: Payer: Self-pay | Admitting: Oncology

## 2012-12-27 LAB — COMPREHENSIVE METABOLIC PANEL
Albumin: 3.1 g/dL — ABNORMAL LOW (ref 3.4–5.0)
Bilirubin,Total: 0.4 mg/dL (ref 0.2–1.0)
Chloride: 97 mmol/L — ABNORMAL LOW (ref 98–107)
Co2: 30 mmol/L (ref 21–32)
Creatinine: 1.83 mg/dL — ABNORMAL HIGH (ref 0.60–1.30)
EGFR (African American): 35 — ABNORMAL LOW
EGFR (Non-African Amer.): 30 — ABNORMAL LOW
Osmolality: 286 (ref 275–301)
Potassium: 4 mmol/L (ref 3.5–5.1)
SGOT(AST): 19 U/L (ref 15–37)
SGPT (ALT): 24 U/L (ref 12–78)
Sodium: 135 mmol/L — ABNORMAL LOW (ref 136–145)
Total Protein: 7.2 g/dL (ref 6.4–8.2)

## 2012-12-27 LAB — CBC CANCER CENTER
Basophil #: 0 x10 3/mm (ref 0.0–0.1)
Basophil %: 0.3 %
Eosinophil %: 1.1 %
HCT: 46.7 % (ref 35.0–47.0)
HGB: 15.6 g/dL (ref 12.0–16.0)
Lymphocyte #: 1.5 x10 3/mm (ref 1.0–3.6)
Lymphocyte %: 15.9 %
MCH: 32.9 pg (ref 26.0–34.0)
MCHC: 33.4 g/dL (ref 32.0–36.0)
Neutrophil %: 77.1 %
Platelet: 167 x10 3/mm (ref 150–440)
RDW: 16.9 % — ABNORMAL HIGH (ref 11.5–14.5)
WBC: 9.1 x10 3/mm (ref 3.6–11.0)

## 2013-01-08 ENCOUNTER — Ambulatory Visit: Payer: Self-pay | Admitting: Oncology

## 2013-01-26 ENCOUNTER — Ambulatory Visit: Payer: Self-pay | Admitting: Oncology

## 2013-01-31 LAB — COMPREHENSIVE METABOLIC PANEL
Alkaline Phosphatase: 99 U/L (ref 50–136)
Chloride: 99 mmol/L (ref 98–107)
Co2: 33 mmol/L — ABNORMAL HIGH (ref 21–32)
Creatinine: 1.64 mg/dL — ABNORMAL HIGH (ref 0.60–1.30)
EGFR (Non-African Amer.): 34 — ABNORMAL LOW
Osmolality: 283 (ref 275–301)
Potassium: 4.6 mmol/L (ref 3.5–5.1)
SGOT(AST): 17 U/L (ref 15–37)
SGPT (ALT): 19 U/L (ref 12–78)
Total Protein: 7.7 g/dL (ref 6.4–8.2)

## 2013-01-31 LAB — CBC CANCER CENTER
Basophil #: 0.1 x10 3/mm (ref 0.0–0.1)
Eosinophil #: 0.1 x10 3/mm (ref 0.0–0.7)
HCT: 48.5 % — ABNORMAL HIGH (ref 35.0–47.0)
HGB: 16.4 g/dL — ABNORMAL HIGH (ref 12.0–16.0)
Lymphocyte #: 1.9 x10 3/mm (ref 1.0–3.6)
Lymphocyte %: 18.5 %
MCH: 32.1 pg (ref 26.0–34.0)
MCHC: 33.8 g/dL (ref 32.0–36.0)
MCV: 95 fL (ref 80–100)
RBC: 5.12 10*6/uL (ref 3.80–5.20)
WBC: 10.5 x10 3/mm (ref 3.6–11.0)

## 2013-02-23 ENCOUNTER — Ambulatory Visit: Payer: Self-pay | Admitting: Oncology

## 2013-03-14 LAB — COMPREHENSIVE METABOLIC PANEL
Alkaline Phosphatase: 103 U/L (ref 50–136)
Anion Gap: 8 (ref 7–16)
Chloride: 99 mmol/L (ref 98–107)
Co2: 29 mmol/L (ref 21–32)
EGFR (African American): 36 — ABNORMAL LOW
EGFR (Non-African Amer.): 31 — ABNORMAL LOW
Glucose: 116 mg/dL — ABNORMAL HIGH (ref 65–99)
Osmolality: 277 (ref 275–301)
Potassium: 4.7 mmol/L (ref 3.5–5.1)
SGPT (ALT): 20 U/L (ref 12–78)
Total Protein: 7.6 g/dL (ref 6.4–8.2)

## 2013-03-14 LAB — CBC CANCER CENTER
Basophil #: 0.1 x10 3/mm (ref 0.0–0.1)
Basophil %: 0.7 %
Eosinophil #: 0.1 x10 3/mm (ref 0.0–0.7)
HGB: 15.1 g/dL (ref 12.0–16.0)
Lymphocyte #: 1.6 x10 3/mm (ref 1.0–3.6)
MCH: 30.3 pg (ref 26.0–34.0)
MCHC: 33.4 g/dL (ref 32.0–36.0)
Monocyte #: 0.8 x10 3/mm (ref 0.2–0.9)
Monocyte %: 7.8 %
Neutrophil %: 75.6 %
RBC: 4.98 10*6/uL (ref 3.80–5.20)
RDW: 16.5 % — ABNORMAL HIGH (ref 11.5–14.5)
WBC: 10.5 x10 3/mm (ref 3.6–11.0)

## 2013-03-26 ENCOUNTER — Ambulatory Visit: Payer: Self-pay | Admitting: Oncology

## 2013-04-25 ENCOUNTER — Ambulatory Visit: Payer: Self-pay | Admitting: Oncology

## 2013-05-02 LAB — COMPREHENSIVE METABOLIC PANEL WITH GFR
Albumin: 3.3 g/dL — ABNORMAL LOW
Alkaline Phosphatase: 121 U/L
Anion Gap: 5 — ABNORMAL LOW
BUN: 18 mg/dL
Bilirubin,Total: 0.4 mg/dL
Calcium, Total: 9.5 mg/dL
Chloride: 98 mmol/L
Co2: 34 mmol/L — ABNORMAL HIGH
Creatinine: 1.83 mg/dL — ABNORMAL HIGH
EGFR (African American): 35 — ABNORMAL LOW
EGFR (Non-African Amer.): 30 — ABNORMAL LOW
Glucose: 191 mg/dL — ABNORMAL HIGH
Osmolality: 281
Potassium: 3.9 mmol/L
SGOT(AST): 13 U/L — ABNORMAL LOW
SGPT (ALT): 19 U/L
Sodium: 137 mmol/L
Total Protein: 7.8 g/dL

## 2013-05-02 LAB — CBC CANCER CENTER
Basophil #: 0.1 "x10 3/mm "
Basophil %: 0.7 %
Eosinophil #: 0.1 "x10 3/mm "
Eosinophil %: 1.3 %
HCT: 46.6 %
HGB: 15.6 g/dL
Lymphocyte %: 19.6 %
Lymphs Abs: 1.9 "x10 3/mm "
MCH: 30.9 pg
MCHC: 33.5 g/dL
MCV: 92 fL
Monocyte #: 0.6 "x10 3/mm "
Monocyte %: 6.7 %
Neutrophil #: 6.9 "x10 3/mm " — ABNORMAL HIGH
Neutrophil %: 71.7 %
Platelet: 195 "x10 3/mm "
RBC: 5.06 "x10 6/mm "
RDW: 17.8 % — ABNORMAL HIGH
WBC: 9.6 "x10 3/mm "

## 2013-05-26 ENCOUNTER — Ambulatory Visit: Payer: Self-pay | Admitting: Oncology

## 2013-05-28 ENCOUNTER — Ambulatory Visit: Payer: Self-pay | Admitting: Oncology

## 2013-06-25 ENCOUNTER — Ambulatory Visit: Payer: Self-pay | Admitting: Oncology

## 2013-07-11 LAB — CBC CANCER CENTER
Basophil #: 0.1 x10 3/mm (ref 0.0–0.1)
Eosinophil #: 0.1 x10 3/mm (ref 0.0–0.7)
Eosinophil %: 1.4 %
HCT: 45.1 % (ref 35.0–47.0)
HGB: 15.9 g/dL (ref 12.0–16.0)
MCH: 32.1 pg (ref 26.0–34.0)
MCHC: 35.3 g/dL (ref 32.0–36.0)
MCV: 91 fL (ref 80–100)
Monocyte #: 0.6 x10 3/mm (ref 0.2–0.9)
Neutrophil #: 6.6 x10 3/mm — ABNORMAL HIGH (ref 1.4–6.5)
Neutrophil %: 73.9 %
Platelet: 175 x10 3/mm (ref 150–440)
RBC: 4.96 10*6/uL (ref 3.80–5.20)
WBC: 9 x10 3/mm (ref 3.6–11.0)

## 2013-07-18 LAB — CBC CANCER CENTER
Basophil %: 0.8 %
Eosinophil %: 1.7 %
HCT: 44 % (ref 35.0–47.0)
Lymphocyte %: 18.2 %
MCH: 31.6 pg (ref 26.0–34.0)
MCHC: 34.6 g/dL (ref 32.0–36.0)
Monocyte #: 0.6 x10 3/mm (ref 0.2–0.9)
Monocyte %: 6.7 %
Neutrophil %: 72.6 %
Platelet: 171 x10 3/mm (ref 150–440)
RDW: 16.7 % — ABNORMAL HIGH (ref 11.5–14.5)

## 2013-07-26 ENCOUNTER — Ambulatory Visit: Payer: Self-pay | Admitting: Oncology

## 2013-08-26 ENCOUNTER — Ambulatory Visit: Payer: Self-pay | Admitting: Oncology

## 2013-09-25 ENCOUNTER — Ambulatory Visit: Payer: Self-pay | Admitting: Oncology

## 2013-11-12 ENCOUNTER — Ambulatory Visit: Payer: Self-pay | Admitting: Oncology

## 2013-11-14 ENCOUNTER — Ambulatory Visit: Payer: Self-pay | Admitting: Oncology

## 2013-11-14 LAB — CBC CANCER CENTER
Basophil #: 0 x10 3/mm (ref 0.0–0.1)
Basophil %: 0.5 %
Lymphocyte #: 1.3 x10 3/mm (ref 1.0–3.6)
Lymphocyte %: 15.1 %
MCH: 31.1 pg (ref 26.0–34.0)
MCHC: 33.1 g/dL (ref 32.0–36.0)
MCV: 94 fL (ref 80–100)
Monocyte %: 7.8 %
Neutrophil #: 6.5 x10 3/mm (ref 1.4–6.5)
Neutrophil %: 75.4 %
Platelet: 211 x10 3/mm (ref 150–440)
RBC: 4.86 10*6/uL (ref 3.80–5.20)
RDW: 15.7 % — ABNORMAL HIGH (ref 11.5–14.5)
WBC: 8.6 x10 3/mm (ref 3.6–11.0)

## 2013-11-14 LAB — COMPREHENSIVE METABOLIC PANEL
Albumin: 3 g/dL — ABNORMAL LOW (ref 3.4–5.0)
Alkaline Phosphatase: 103 U/L (ref 50–136)
Anion Gap: 4 — ABNORMAL LOW (ref 7–16)
BUN: 20 mg/dL — ABNORMAL HIGH (ref 7–18)
Calcium, Total: 9.5 mg/dL (ref 8.5–10.1)
Chloride: 100 mmol/L (ref 98–107)
Creatinine: 1.54 mg/dL — ABNORMAL HIGH (ref 0.60–1.30)
EGFR (Non-African Amer.): 37 — ABNORMAL LOW
Glucose: 126 mg/dL — ABNORMAL HIGH (ref 65–99)
Osmolality: 280 (ref 275–301)
SGOT(AST): 12 U/L — ABNORMAL LOW (ref 15–37)
SGPT (ALT): 16 U/L (ref 12–78)

## 2013-11-25 ENCOUNTER — Ambulatory Visit: Payer: Self-pay | Admitting: Oncology

## 2013-12-16 LAB — CBC CANCER CENTER
Basophil #: 0.1 x10 3/mm (ref 0.0–0.1)
Eosinophil #: 0.1 x10 3/mm (ref 0.0–0.7)
Eosinophil %: 1.6 %
HCT: 47.5 % — ABNORMAL HIGH (ref 35.0–47.0)
MCH: 30.8 pg (ref 26.0–34.0)
MCHC: 32.9 g/dL (ref 32.0–36.0)
MCV: 94 fL (ref 80–100)
Monocyte #: 0.6 x10 3/mm (ref 0.2–0.9)
Monocyte %: 6.8 %
Neutrophil #: 6.6 x10 3/mm — ABNORMAL HIGH (ref 1.4–6.5)
WBC: 8.4 x10 3/mm (ref 3.6–11.0)

## 2013-12-16 LAB — COMPREHENSIVE METABOLIC PANEL
Anion Gap: 2 — ABNORMAL LOW (ref 7–16)
BUN: 26 mg/dL — ABNORMAL HIGH (ref 7–18)
Calcium, Total: 9.6 mg/dL (ref 8.5–10.1)
Chloride: 99 mmol/L (ref 98–107)
Co2: 35 mmol/L — ABNORMAL HIGH (ref 21–32)
EGFR (African American): 42 — ABNORMAL LOW
EGFR (Non-African Amer.): 36 — ABNORMAL LOW
Glucose: 157 mg/dL — ABNORMAL HIGH (ref 65–99)
Osmolality: 280 (ref 275–301)
SGOT(AST): 11 U/L — ABNORMAL LOW (ref 15–37)
SGPT (ALT): 16 U/L (ref 12–78)
Sodium: 136 mmol/L (ref 136–145)

## 2013-12-16 LAB — PROTEIN, URINE, RANDOM: Protein, Random Urine: 20 mg/dL — ABNORMAL HIGH (ref 0–12)

## 2013-12-26 ENCOUNTER — Ambulatory Visit: Payer: Self-pay | Admitting: Oncology

## 2013-12-30 LAB — CBC CANCER CENTER
BASOS PCT: 0.7 %
Basophil #: 0.1 x10 3/mm (ref 0.0–0.1)
Eosinophil #: 0.1 x10 3/mm (ref 0.0–0.7)
Eosinophil %: 1.3 %
HCT: 49.8 % — AB (ref 35.0–47.0)
HGB: 16.3 g/dL — ABNORMAL HIGH (ref 12.0–16.0)
LYMPHS ABS: 1.9 x10 3/mm (ref 1.0–3.6)
LYMPHS PCT: 18.7 %
MCH: 31.1 pg (ref 26.0–34.0)
MCHC: 32.8 g/dL (ref 32.0–36.0)
MCV: 95 fL (ref 80–100)
MONO ABS: 0.9 x10 3/mm (ref 0.2–0.9)
MONOS PCT: 8.5 %
NEUTROS ABS: 7.3 x10 3/mm — AB (ref 1.4–6.5)
Neutrophil %: 70.8 %
PLATELETS: 187 x10 3/mm (ref 150–440)
RBC: 5.25 10*6/uL — AB (ref 3.80–5.20)
RDW: 16.5 % — ABNORMAL HIGH (ref 11.5–14.5)
WBC: 10.3 x10 3/mm (ref 3.6–11.0)

## 2014-01-05 ENCOUNTER — Emergency Department: Payer: Self-pay | Admitting: Emergency Medicine

## 2014-01-07 LAB — COMPREHENSIVE METABOLIC PANEL
Albumin: 3 g/dL — ABNORMAL LOW (ref 3.4–5.0)
Alkaline Phosphatase: 96 U/L
Anion Gap: 2 — ABNORMAL LOW (ref 7–16)
BUN: 25 mg/dL — AB (ref 7–18)
Bilirubin,Total: 0.4 mg/dL (ref 0.2–1.0)
CALCIUM: 9.3 mg/dL (ref 8.5–10.1)
CO2: 35 mmol/L — AB (ref 21–32)
Chloride: 97 mmol/L — ABNORMAL LOW (ref 98–107)
Creatinine: 1.87 mg/dL — ABNORMAL HIGH (ref 0.60–1.30)
EGFR (African American): 34 — ABNORMAL LOW
EGFR (Non-African Amer.): 29 — ABNORMAL LOW
Glucose: 73 mg/dL (ref 65–99)
OSMOLALITY: 271 (ref 275–301)
POTASSIUM: 4.1 mmol/L (ref 3.5–5.1)
SGOT(AST): 19 U/L (ref 15–37)
SGPT (ALT): 13 U/L (ref 12–78)
SODIUM: 134 mmol/L — AB (ref 136–145)
Total Protein: 7.4 g/dL (ref 6.4–8.2)

## 2014-01-07 LAB — CBC CANCER CENTER
BASOS ABS: 0.1 x10 3/mm (ref 0.0–0.1)
BASOS PCT: 1.2 %
EOS PCT: 0.4 %
Eosinophil #: 0 x10 3/mm (ref 0.0–0.7)
HCT: 45.3 % (ref 35.0–47.0)
HGB: 15.1 g/dL (ref 12.0–16.0)
LYMPHS ABS: 1.5 x10 3/mm (ref 1.0–3.6)
Lymphocyte %: 14 %
MCH: 31 pg (ref 26.0–34.0)
MCHC: 33.4 g/dL (ref 32.0–36.0)
MCV: 93 fL (ref 80–100)
MONOS PCT: 6.8 %
Monocyte #: 0.7 x10 3/mm (ref 0.2–0.9)
NEUTROS ABS: 8.3 x10 3/mm — AB (ref 1.4–6.5)
NEUTROS PCT: 77.6 %
Platelet: 239 x10 3/mm (ref 150–440)
RBC: 4.89 10*6/uL (ref 3.80–5.20)
RDW: 16.4 % — ABNORMAL HIGH (ref 11.5–14.5)
WBC: 10.7 x10 3/mm (ref 3.6–11.0)

## 2014-01-13 LAB — PROTEIN, URINE, RANDOM: Protein, Random Urine: 19 mg/dL — ABNORMAL HIGH (ref 0–12)

## 2014-01-26 ENCOUNTER — Ambulatory Visit: Payer: Self-pay | Admitting: Oncology

## 2014-02-10 LAB — CBC CANCER CENTER
Basophil #: 0.1 x10 3/mm (ref 0.0–0.1)
Basophil %: 1.1 %
Eosinophil #: 0.2 x10 3/mm (ref 0.0–0.7)
Eosinophil %: 2 %
HCT: 46.1 % (ref 35.0–47.0)
HGB: 14.9 g/dL (ref 12.0–16.0)
LYMPHS ABS: 1.9 x10 3/mm (ref 1.0–3.6)
Lymphocyte %: 18.6 %
MCH: 30.4 pg (ref 26.0–34.0)
MCHC: 32.3 g/dL (ref 32.0–36.0)
MCV: 94 fL (ref 80–100)
MONOS PCT: 8.6 %
Monocyte #: 0.9 x10 3/mm (ref 0.2–0.9)
NEUTROS PCT: 69.7 %
Neutrophil #: 7.1 x10 3/mm — ABNORMAL HIGH (ref 1.4–6.5)
Platelet: 173 x10 3/mm (ref 150–440)
RBC: 4.89 10*6/uL (ref 3.80–5.20)
RDW: 17.3 % — ABNORMAL HIGH (ref 11.5–14.5)
WBC: 10.2 x10 3/mm (ref 3.6–11.0)

## 2014-02-10 LAB — COMPREHENSIVE METABOLIC PANEL
ALBUMIN: 3.1 g/dL — AB (ref 3.4–5.0)
ALK PHOS: 85 U/L
ALT: 10 U/L — AB (ref 12–78)
AST: 13 U/L — AB (ref 15–37)
Anion Gap: 8 (ref 7–16)
BUN: 21 mg/dL — ABNORMAL HIGH (ref 7–18)
Bilirubin,Total: 0.3 mg/dL (ref 0.2–1.0)
Calcium, Total: 8.7 mg/dL (ref 8.5–10.1)
Chloride: 98 mmol/L (ref 98–107)
Co2: 32 mmol/L (ref 21–32)
Creatinine: 1.44 mg/dL — ABNORMAL HIGH (ref 0.60–1.30)
EGFR (Non-African Amer.): 40 — ABNORMAL LOW
GFR CALC AF AMER: 46 — AB
GLUCOSE: 173 mg/dL — AB (ref 65–99)
OSMOLALITY: 283 (ref 275–301)
Potassium: 4.2 mmol/L (ref 3.5–5.1)
SODIUM: 138 mmol/L (ref 136–145)
Total Protein: 7.1 g/dL (ref 6.4–8.2)

## 2014-02-10 LAB — PROTEIN, URINE, RANDOM: PROTEIN, RANDOM URINE: 25 mg/dL — AB (ref 0–12)

## 2014-02-23 ENCOUNTER — Ambulatory Visit: Payer: Self-pay | Admitting: Oncology

## 2014-02-26 LAB — CBC CANCER CENTER
BASOS ABS: 0.1 x10 3/mm (ref 0.0–0.1)
Basophil %: 1.4 %
EOS ABS: 0.1 x10 3/mm (ref 0.0–0.7)
EOS PCT: 0.7 %
HCT: 45.7 % (ref 35.0–47.0)
HGB: 15 g/dL (ref 12.0–16.0)
Lymphocyte #: 3.1 x10 3/mm (ref 1.0–3.6)
Lymphocyte %: 36.9 %
MCH: 30.5 pg (ref 26.0–34.0)
MCHC: 32.8 g/dL (ref 32.0–36.0)
MCV: 93 fL (ref 80–100)
Monocyte #: 1 x10 3/mm — ABNORMAL HIGH (ref 0.2–0.9)
Monocyte %: 11.8 %
Neutrophil #: 4.1 x10 3/mm (ref 1.4–6.5)
Neutrophil %: 49.2 %
Platelet: 200 x10 3/mm (ref 150–440)
RBC: 4.91 10*6/uL (ref 3.80–5.20)
RDW: 18.1 % — AB (ref 11.5–14.5)
WBC: 8.4 x10 3/mm (ref 3.6–11.0)

## 2014-02-26 LAB — COMPREHENSIVE METABOLIC PANEL
ALK PHOS: 89 U/L
ANION GAP: 3 — AB (ref 7–16)
Albumin: 3.1 g/dL — ABNORMAL LOW (ref 3.4–5.0)
BUN: 18 mg/dL (ref 7–18)
Bilirubin,Total: 0.3 mg/dL (ref 0.2–1.0)
Calcium, Total: 9.9 mg/dL (ref 8.5–10.1)
Chloride: 102 mmol/L (ref 98–107)
Co2: 34 mmol/L — ABNORMAL HIGH (ref 21–32)
Creatinine: 1.3 mg/dL (ref 0.60–1.30)
GFR CALC AF AMER: 52 — AB
GFR CALC NON AF AMER: 45 — AB
Glucose: 36 mg/dL — CL (ref 65–99)
Osmolality: 276 (ref 275–301)
Potassium: 4.1 mmol/L (ref 3.5–5.1)
SGOT(AST): 17 U/L (ref 15–37)
SGPT (ALT): 10 U/L — ABNORMAL LOW (ref 12–78)
Sodium: 139 mmol/L (ref 136–145)
Total Protein: 7.5 g/dL (ref 6.4–8.2)

## 2014-02-26 LAB — GLUCOSE, RANDOM: GLUCOSE: 187 mg/dL — AB (ref 65–99)

## 2014-03-10 ENCOUNTER — Ambulatory Visit: Payer: Self-pay | Admitting: Oncology

## 2014-03-17 LAB — COMPREHENSIVE METABOLIC PANEL
ALK PHOS: 84 U/L
ANION GAP: 6 — AB (ref 7–16)
AST: 14 U/L — AB (ref 15–37)
Albumin: 3 g/dL — ABNORMAL LOW (ref 3.4–5.0)
BUN: 26 mg/dL — AB (ref 7–18)
Bilirubin,Total: 0.3 mg/dL (ref 0.2–1.0)
CALCIUM: 9.7 mg/dL (ref 8.5–10.1)
CREATININE: 1.55 mg/dL — AB (ref 0.60–1.30)
Chloride: 100 mmol/L (ref 98–107)
Co2: 33 mmol/L — ABNORMAL HIGH (ref 21–32)
EGFR (Non-African Amer.): 37 — ABNORMAL LOW
GFR CALC AF AMER: 42 — AB
Glucose: 114 mg/dL — ABNORMAL HIGH (ref 65–99)
Osmolality: 283 (ref 275–301)
Potassium: 4.1 mmol/L (ref 3.5–5.1)
SGPT (ALT): 8 U/L — ABNORMAL LOW (ref 12–78)
Sodium: 139 mmol/L (ref 136–145)
Total Protein: 7 g/dL (ref 6.4–8.2)

## 2014-03-17 LAB — PROTEIN, URINE, RANDOM: Protein, Random Urine: 29 mg/dL — ABNORMAL HIGH (ref 0–12)

## 2014-03-17 LAB — CBC CANCER CENTER
Basophil #: 0.1 x10 3/mm (ref 0.0–0.1)
Basophil %: 1 %
EOS ABS: 0.3 x10 3/mm (ref 0.0–0.7)
EOS PCT: 3.5 %
HCT: 44.9 % (ref 35.0–47.0)
HGB: 14.5 g/dL (ref 12.0–16.0)
LYMPHS ABS: 1.5 x10 3/mm (ref 1.0–3.6)
Lymphocyte %: 19.7 %
MCH: 30.7 pg (ref 26.0–34.0)
MCHC: 32.3 g/dL (ref 32.0–36.0)
MCV: 95 fL (ref 80–100)
MONO ABS: 0.7 x10 3/mm (ref 0.2–0.9)
Monocyte %: 8.9 %
Neutrophil #: 5 x10 3/mm (ref 1.4–6.5)
Neutrophil %: 66.9 %
Platelet: 170 x10 3/mm (ref 150–440)
RBC: 4.72 10*6/uL (ref 3.80–5.20)
RDW: 18.7 % — AB (ref 11.5–14.5)
WBC: 7.5 x10 3/mm (ref 3.6–11.0)

## 2014-03-24 LAB — CBC CANCER CENTER
Basophil #: 0 x10 3/mm (ref 0.0–0.1)
Basophil %: 0.3 %
Eosinophil #: 0.1 x10 3/mm (ref 0.0–0.7)
Eosinophil %: 2.5 %
HCT: 46.5 % (ref 35.0–47.0)
HGB: 15.1 g/dL (ref 12.0–16.0)
Lymphocyte #: 1.5 x10 3/mm (ref 1.0–3.6)
Lymphocyte %: 28.6 %
MCH: 30.6 pg (ref 26.0–34.0)
MCHC: 32.4 g/dL (ref 32.0–36.0)
MCV: 95 fL (ref 80–100)
Monocyte #: 0.2 x10 3/mm (ref 0.2–0.9)
Monocyte %: 4.2 %
Neutrophil #: 3.4 x10 3/mm (ref 1.4–6.5)
Neutrophil %: 64.4 %
Platelet: 216 x10 3/mm (ref 150–440)
RBC: 4.91 10*6/uL (ref 3.80–5.20)
RDW: 18 % — AB (ref 11.5–14.5)
WBC: 5.3 x10 3/mm (ref 3.6–11.0)

## 2014-03-26 ENCOUNTER — Ambulatory Visit: Payer: Self-pay | Admitting: Oncology

## 2014-03-31 LAB — CBC CANCER CENTER
BASOS PCT: 1.4 %
Basophil #: 0.1 x10 3/mm (ref 0.0–0.1)
EOS ABS: 0.1 x10 3/mm (ref 0.0–0.7)
Eosinophil %: 1.6 %
HCT: 47.1 % — ABNORMAL HIGH (ref 35.0–47.0)
HGB: 15.2 g/dL (ref 12.0–16.0)
Lymphocyte #: 2.2 x10 3/mm (ref 1.0–3.6)
Lymphocyte %: 40 %
MCH: 30.7 pg (ref 26.0–34.0)
MCHC: 32.3 g/dL (ref 32.0–36.0)
MCV: 95 fL (ref 80–100)
MONOS PCT: 14.5 %
Monocyte #: 0.8 x10 3/mm (ref 0.2–0.9)
Neutrophil #: 2.3 x10 3/mm (ref 1.4–6.5)
Neutrophil %: 42.5 %
PLATELETS: 198 x10 3/mm (ref 150–440)
RBC: 4.96 10*6/uL (ref 3.80–5.20)
RDW: 19 % — ABNORMAL HIGH (ref 11.5–14.5)
WBC: 5.5 x10 3/mm (ref 3.6–11.0)

## 2014-04-08 LAB — CBC CANCER CENTER
BASOS PCT: 1.5 %
Basophil #: 0.2 x10 3/mm — ABNORMAL HIGH (ref 0.0–0.1)
EOS PCT: 0.8 %
Eosinophil #: 0.1 x10 3/mm (ref 0.0–0.7)
HCT: 47.4 % — AB (ref 35.0–47.0)
HGB: 15.2 g/dL (ref 12.0–16.0)
Lymphocyte #: 2 x10 3/mm (ref 1.0–3.6)
Lymphocyte %: 18 %
MCH: 30.2 pg (ref 26.0–34.0)
MCHC: 32 g/dL (ref 32.0–36.0)
MCV: 94 fL (ref 80–100)
Monocyte #: 0.7 x10 3/mm (ref 0.2–0.9)
Monocyte %: 6.7 %
NEUTROS PCT: 73 %
Neutrophil #: 8.1 x10 3/mm — ABNORMAL HIGH (ref 1.4–6.5)
Platelet: 208 x10 3/mm (ref 150–440)
RBC: 5.02 10*6/uL (ref 3.80–5.20)
RDW: 18.6 % — AB (ref 11.5–14.5)
WBC: 11.1 x10 3/mm — ABNORMAL HIGH (ref 3.6–11.0)

## 2014-04-14 LAB — COMPREHENSIVE METABOLIC PANEL
ALT: 9 U/L — AB (ref 12–78)
Albumin: 3 g/dL — ABNORMAL LOW (ref 3.4–5.0)
Alkaline Phosphatase: 75 U/L
Anion Gap: 7 (ref 7–16)
BILIRUBIN TOTAL: 0.3 mg/dL (ref 0.2–1.0)
BUN: 29 mg/dL — AB (ref 7–18)
CHLORIDE: 99 mmol/L (ref 98–107)
CREATININE: 1.46 mg/dL — AB (ref 0.60–1.30)
Calcium, Total: 9.7 mg/dL (ref 8.5–10.1)
Co2: 34 mmol/L — ABNORMAL HIGH (ref 21–32)
EGFR (Non-African Amer.): 39 — ABNORMAL LOW
GFR CALC AF AMER: 46 — AB
Glucose: 47 mg/dL — ABNORMAL LOW (ref 65–99)
Osmolality: 282 (ref 275–301)
POTASSIUM: 4 mmol/L (ref 3.5–5.1)
SGOT(AST): 13 U/L — ABNORMAL LOW (ref 15–37)
Sodium: 140 mmol/L (ref 136–145)
Total Protein: 7.2 g/dL (ref 6.4–8.2)

## 2014-04-14 LAB — CBC CANCER CENTER
Basophil #: 0.1 x10 3/mm (ref 0.0–0.1)
Basophil %: 1.1 %
Eosinophil #: 0.2 x10 3/mm (ref 0.0–0.7)
Eosinophil %: 2 %
HCT: 43.3 % (ref 35.0–47.0)
HGB: 14.1 g/dL (ref 12.0–16.0)
Lymphocyte #: 2.2 x10 3/mm (ref 1.0–3.6)
Lymphocyte %: 21.7 %
MCH: 30.8 pg (ref 26.0–34.0)
MCHC: 32.6 g/dL (ref 32.0–36.0)
MCV: 95 fL (ref 80–100)
MONO ABS: 0.8 x10 3/mm (ref 0.2–0.9)
Monocyte %: 7.6 %
Neutrophil #: 7 x10 3/mm — ABNORMAL HIGH (ref 1.4–6.5)
Neutrophil %: 67.6 %
Platelet: 138 x10 3/mm — ABNORMAL LOW (ref 150–440)
RBC: 4.58 10*6/uL (ref 3.80–5.20)
RDW: 18.9 % — ABNORMAL HIGH (ref 11.5–14.5)
WBC: 10.3 x10 3/mm (ref 3.6–11.0)

## 2014-04-25 ENCOUNTER — Ambulatory Visit: Payer: Self-pay | Admitting: Oncology

## 2014-05-12 LAB — CBC CANCER CENTER
BASOS PCT: 1.3 %
Basophil #: 0.1 x10 3/mm (ref 0.0–0.1)
Eosinophil #: 0.2 x10 3/mm (ref 0.0–0.7)
Eosinophil %: 1.9 %
HCT: 43.3 % (ref 35.0–47.0)
HGB: 14.7 g/dL (ref 12.0–16.0)
Lymphocyte #: 2 x10 3/mm (ref 1.0–3.6)
Lymphocyte %: 20 %
MCH: 31.7 pg (ref 26.0–34.0)
MCHC: 34 g/dL (ref 32.0–36.0)
MCV: 93 fL (ref 80–100)
MONO ABS: 0.6 x10 3/mm (ref 0.2–0.9)
Monocyte %: 6.3 %
NEUTROS ABS: 7.1 x10 3/mm — AB (ref 1.4–6.5)
NEUTROS PCT: 70.5 %
Platelet: 186 x10 3/mm (ref 150–440)
RBC: 4.65 10*6/uL (ref 3.80–5.20)
RDW: 19.4 % — ABNORMAL HIGH (ref 11.5–14.5)
WBC: 10 x10 3/mm (ref 3.6–11.0)

## 2014-05-12 LAB — COMPREHENSIVE METABOLIC PANEL
ALK PHOS: 75 U/L
Albumin: 3 g/dL — ABNORMAL LOW (ref 3.4–5.0)
Anion Gap: 5 — ABNORMAL LOW (ref 7–16)
BUN: 17 mg/dL (ref 7–18)
Bilirubin,Total: 0.3 mg/dL (ref 0.2–1.0)
CALCIUM: 9.6 mg/dL (ref 8.5–10.1)
CHLORIDE: 100 mmol/L (ref 98–107)
CO2: 35 mmol/L — AB (ref 21–32)
Creatinine: 1.29 mg/dL (ref 0.60–1.30)
EGFR (African American): 53 — ABNORMAL LOW
GFR CALC NON AF AMER: 46 — AB
Glucose: 97 mg/dL (ref 65–99)
OSMOLALITY: 281 (ref 275–301)
Potassium: 4.4 mmol/L (ref 3.5–5.1)
SGOT(AST): 8 U/L — ABNORMAL LOW (ref 15–37)
SGPT (ALT): 10 U/L — ABNORMAL LOW (ref 12–78)
SODIUM: 140 mmol/L (ref 136–145)
Total Protein: 7.2 g/dL (ref 6.4–8.2)

## 2014-05-12 LAB — PROTEIN, URINE, RANDOM: Protein, Random Urine: 27 mg/dL — ABNORMAL HIGH (ref 0–12)

## 2014-05-26 ENCOUNTER — Ambulatory Visit: Payer: Self-pay | Admitting: Oncology

## 2014-06-18 ENCOUNTER — Ambulatory Visit: Payer: Self-pay | Admitting: Oncology

## 2014-07-15 ENCOUNTER — Ambulatory Visit: Payer: Self-pay | Admitting: Oncology

## 2014-08-26 ENCOUNTER — Ambulatory Visit: Payer: Self-pay | Admitting: Oncology

## 2014-09-25 ENCOUNTER — Ambulatory Visit: Payer: Self-pay | Admitting: Oncology

## 2014-10-07 LAB — COMPREHENSIVE METABOLIC PANEL
ANION GAP: 4 — AB (ref 7–16)
Albumin: 3.1 g/dL — ABNORMAL LOW (ref 3.4–5.0)
Alkaline Phosphatase: 90 U/L
BUN: 23 mg/dL — AB (ref 7–18)
Bilirubin,Total: 0.4 mg/dL (ref 0.2–1.0)
CHLORIDE: 99 mmol/L (ref 98–107)
CO2: 34 mmol/L — AB (ref 21–32)
Calcium, Total: 9 mg/dL (ref 8.5–10.1)
Creatinine: 1.34 mg/dL — ABNORMAL HIGH (ref 0.60–1.30)
EGFR (Non-African Amer.): 43 — ABNORMAL LOW
GFR CALC AF AMER: 52 — AB
GLUCOSE: 160 mg/dL — AB (ref 65–99)
OSMOLALITY: 281 (ref 275–301)
Potassium: 3.8 mmol/L (ref 3.5–5.1)
SGOT(AST): 17 U/L (ref 15–37)
SGPT (ALT): 16 U/L
Sodium: 137 mmol/L (ref 136–145)
TOTAL PROTEIN: 7.3 g/dL (ref 6.4–8.2)

## 2014-10-07 LAB — CBC CANCER CENTER
Basophil #: 0.1 x10 3/mm (ref 0.0–0.1)
Basophil %: 0.9 %
Eosinophil #: 0.1 x10 3/mm (ref 0.0–0.7)
Eosinophil %: 1 %
HCT: 47.2 % — ABNORMAL HIGH (ref 35.0–47.0)
HGB: 15.2 g/dL (ref 12.0–16.0)
Lymphocyte #: 1.1 x10 3/mm (ref 1.0–3.6)
Lymphocyte %: 14.7 %
MCH: 29.4 pg (ref 26.0–34.0)
MCHC: 32.1 g/dL (ref 32.0–36.0)
MCV: 92 fL (ref 80–100)
MONOS PCT: 7.8 %
Monocyte #: 0.6 x10 3/mm (ref 0.2–0.9)
NEUTROS ABS: 5.7 x10 3/mm (ref 1.4–6.5)
NEUTROS PCT: 75.6 %
PLATELETS: 160 x10 3/mm (ref 150–440)
RBC: 5.16 10*6/uL (ref 3.80–5.20)
RDW: 18 % — ABNORMAL HIGH (ref 11.5–14.5)
WBC: 7.6 x10 3/mm (ref 3.6–11.0)

## 2014-10-26 ENCOUNTER — Ambulatory Visit: Payer: Self-pay | Admitting: Oncology

## 2014-12-01 ENCOUNTER — Ambulatory Visit: Payer: Self-pay | Admitting: Oncology

## 2014-12-03 ENCOUNTER — Ambulatory Visit: Payer: Self-pay | Admitting: Oncology

## 2014-12-03 LAB — COMPREHENSIVE METABOLIC PANEL
Albumin: 3.2 g/dL — ABNORMAL LOW (ref 3.4–5.0)
Alkaline Phosphatase: 111 U/L
Anion Gap: 2 — ABNORMAL LOW (ref 7–16)
BILIRUBIN TOTAL: 0.4 mg/dL (ref 0.2–1.0)
BUN: 22 mg/dL — ABNORMAL HIGH (ref 7–18)
CHLORIDE: 96 mmol/L — AB (ref 98–107)
Calcium, Total: 9.9 mg/dL (ref 8.5–10.1)
Co2: 37 mmol/L — ABNORMAL HIGH (ref 21–32)
Creatinine: 1.5 mg/dL — ABNORMAL HIGH (ref 0.60–1.30)
EGFR (African American): 46 — ABNORMAL LOW
GFR CALC NON AF AMER: 38 — AB
Glucose: 251 mg/dL — ABNORMAL HIGH (ref 65–99)
OSMOLALITY: 282 (ref 275–301)
POTASSIUM: 4.4 mmol/L (ref 3.5–5.1)
SGOT(AST): 12 U/L — ABNORMAL LOW (ref 15–37)
SGPT (ALT): 18 U/L
Sodium: 135 mmol/L — ABNORMAL LOW (ref 136–145)
Total Protein: 7.6 g/dL (ref 6.4–8.2)

## 2014-12-03 LAB — CBC CANCER CENTER
Basophil #: 0.1 x10 3/mm (ref 0.0–0.1)
Basophil %: 1.1 %
EOS PCT: 1.4 %
Eosinophil #: 0.1 x10 3/mm (ref 0.0–0.7)
HCT: 49.1 % — AB (ref 35.0–47.0)
HGB: 15.8 g/dL (ref 12.0–16.0)
LYMPHS ABS: 1.4 x10 3/mm (ref 1.0–3.6)
LYMPHS PCT: 17 %
MCH: 30.1 pg (ref 26.0–34.0)
MCHC: 32.3 g/dL (ref 32.0–36.0)
MCV: 93 fL (ref 80–100)
Monocyte #: 0.5 x10 3/mm (ref 0.2–0.9)
Monocyte %: 6 %
NEUTROS PCT: 74.5 %
Neutrophil #: 6.1 x10 3/mm (ref 1.4–6.5)
PLATELETS: 178 x10 3/mm (ref 150–440)
RBC: 5.26 10*6/uL — ABNORMAL HIGH (ref 3.80–5.20)
RDW: 18.3 % — ABNORMAL HIGH (ref 11.5–14.5)
WBC: 8.3 x10 3/mm (ref 3.6–11.0)

## 2014-12-04 DIAGNOSIS — N183 Chronic kidney disease, stage 3 unspecified: Secondary | ICD-10-CM | POA: Insufficient documentation

## 2014-12-04 DIAGNOSIS — F329 Major depressive disorder, single episode, unspecified: Secondary | ICD-10-CM | POA: Insufficient documentation

## 2014-12-04 DIAGNOSIS — F32A Depression, unspecified: Secondary | ICD-10-CM | POA: Insufficient documentation

## 2014-12-04 DIAGNOSIS — J449 Chronic obstructive pulmonary disease, unspecified: Secondary | ICD-10-CM | POA: Insufficient documentation

## 2014-12-04 DIAGNOSIS — F419 Anxiety disorder, unspecified: Secondary | ICD-10-CM | POA: Insufficient documentation

## 2014-12-04 DIAGNOSIS — I10 Essential (primary) hypertension: Secondary | ICD-10-CM | POA: Insufficient documentation

## 2014-12-04 DIAGNOSIS — E119 Type 2 diabetes mellitus without complications: Secondary | ICD-10-CM | POA: Insufficient documentation

## 2014-12-26 ENCOUNTER — Ambulatory Visit: Payer: Self-pay | Admitting: Oncology

## 2015-01-01 LAB — CBC CANCER CENTER
BASOS PCT: 0.8 %
Basophil #: 0.1 x10 3/mm (ref 0.0–0.1)
EOS PCT: 1.2 %
Eosinophil #: 0.1 x10 3/mm (ref 0.0–0.7)
HCT: 46.3 % (ref 35.0–47.0)
HGB: 15.3 g/dL (ref 12.0–16.0)
LYMPHS ABS: 1.3 x10 3/mm (ref 1.0–3.6)
Lymphocyte %: 14.6 %
MCH: 30.3 pg (ref 26.0–34.0)
MCHC: 33 g/dL (ref 32.0–36.0)
MCV: 92 fL (ref 80–100)
MONO ABS: 0.7 x10 3/mm (ref 0.2–0.9)
MONOS PCT: 7.7 %
NEUTROS ABS: 6.8 x10 3/mm — AB (ref 1.4–6.5)
Neutrophil %: 75.7 %
PLATELETS: 167 x10 3/mm (ref 150–440)
RBC: 5.04 10*6/uL (ref 3.80–5.20)
RDW: 16.9 % — ABNORMAL HIGH (ref 11.5–14.5)
WBC: 9 x10 3/mm (ref 3.6–11.0)

## 2015-01-01 LAB — COMPREHENSIVE METABOLIC PANEL
ALBUMIN: 3 g/dL — AB (ref 3.4–5.0)
ANION GAP: 5 — AB (ref 7–16)
Alkaline Phosphatase: 110 U/L
BUN: 22 mg/dL — ABNORMAL HIGH (ref 7–18)
Bilirubin,Total: 0.4 mg/dL (ref 0.2–1.0)
CALCIUM: 9.1 mg/dL (ref 8.5–10.1)
CHLORIDE: 96 mmol/L — AB (ref 98–107)
CREATININE: 1.46 mg/dL — AB (ref 0.60–1.30)
Co2: 36 mmol/L — ABNORMAL HIGH (ref 21–32)
EGFR (African American): 47 — ABNORMAL LOW
EGFR (Non-African Amer.): 39 — ABNORMAL LOW
Glucose: 237 mg/dL — ABNORMAL HIGH (ref 65–99)
Osmolality: 285 (ref 275–301)
POTASSIUM: 3.9 mmol/L (ref 3.5–5.1)
SGOT(AST): 14 U/L — ABNORMAL LOW (ref 15–37)
SGPT (ALT): 21 U/L
Sodium: 137 mmol/L (ref 136–145)
Total Protein: 7.3 g/dL (ref 6.4–8.2)

## 2015-01-26 ENCOUNTER — Ambulatory Visit: Payer: Self-pay | Admitting: Oncology

## 2015-01-29 LAB — COMPREHENSIVE METABOLIC PANEL
ALK PHOS: 99 U/L (ref 46–116)
ALT: 22 U/L (ref 14–63)
Albumin: 3.1 g/dL — ABNORMAL LOW (ref 3.4–5.0)
Anion Gap: 7 (ref 7–16)
BILIRUBIN TOTAL: 0.4 mg/dL (ref 0.2–1.0)
BUN: 24 mg/dL — AB (ref 7–18)
CHLORIDE: 93 mmol/L — AB (ref 98–107)
Calcium, Total: 9.4 mg/dL (ref 8.5–10.1)
Co2: 33 mmol/L — ABNORMAL HIGH (ref 21–32)
Creatinine: 1.43 mg/dL — ABNORMAL HIGH (ref 0.60–1.30)
EGFR (African American): 48 — ABNORMAL LOW
EGFR (Non-African Amer.): 40 — ABNORMAL LOW
Glucose: 302 mg/dL — ABNORMAL HIGH (ref 65–99)
Osmolality: 284 (ref 275–301)
Potassium: 4 mmol/L (ref 3.5–5.1)
SGOT(AST): 18 U/L (ref 15–37)
Sodium: 133 mmol/L — ABNORMAL LOW (ref 136–145)
TOTAL PROTEIN: 7.4 g/dL (ref 6.4–8.2)

## 2015-01-29 LAB — CBC CANCER CENTER
BASOS ABS: 0.1 x10 3/mm (ref 0.0–0.1)
BASOS PCT: 1.1 %
Eosinophil #: 0.1 x10 3/mm (ref 0.0–0.7)
Eosinophil %: 1.5 %
HCT: 44.3 % (ref 35.0–47.0)
HGB: 14.9 g/dL (ref 12.0–16.0)
LYMPHS PCT: 15.3 %
Lymphocyte #: 1.3 x10 3/mm (ref 1.0–3.6)
MCH: 31 pg (ref 26.0–34.0)
MCHC: 33.6 g/dL (ref 32.0–36.0)
MCV: 92 fL (ref 80–100)
Monocyte #: 0.6 x10 3/mm (ref 0.2–0.9)
Monocyte %: 7.1 %
NEUTROS ABS: 6.1 x10 3/mm (ref 1.4–6.5)
Neutrophil %: 75 %
Platelet: 166 x10 3/mm (ref 150–440)
RBC: 4.8 10*6/uL (ref 3.80–5.20)
RDW: 16.8 % — ABNORMAL HIGH (ref 11.5–14.5)
WBC: 8.2 x10 3/mm (ref 3.6–11.0)

## 2015-01-29 LAB — TSH: THYROID STIMULATING HORM: 1.43 u[IU]/mL

## 2015-02-24 ENCOUNTER — Ambulatory Visit
Admit: 2015-02-24 | Disposition: A | Discharge status: Home / Self Care | Payer: Self-pay | Attending: Oncology | Admitting: Oncology

## 2015-03-26 LAB — CBC CANCER CENTER
BASOS PCT: 1.3 %
Basophil #: 0.1 x10 3/mm (ref 0.0–0.1)
EOS PCT: 2.3 %
Eosinophil #: 0.2 x10 3/mm (ref 0.0–0.7)
HCT: 44.6 % (ref 35.0–47.0)
HGB: 15.1 g/dL (ref 12.0–16.0)
LYMPHS ABS: 1.4 x10 3/mm (ref 1.0–3.6)
Lymphocyte %: 21.1 %
MCH: 31.5 pg (ref 26.0–34.0)
MCHC: 33.8 g/dL (ref 32.0–36.0)
MCV: 93 fL (ref 80–100)
Monocyte #: 0.6 x10 3/mm (ref 0.2–0.9)
Monocyte %: 8.4 %
Neutrophil #: 4.5 x10 3/mm (ref 1.4–6.5)
Neutrophil %: 66.9 %
Platelet: 155 x10 3/mm (ref 150–440)
RBC: 4.79 10*6/uL (ref 3.80–5.20)
RDW: 15.9 % — AB (ref 11.5–14.5)
WBC: 6.8 x10 3/mm (ref 3.6–11.0)

## 2015-03-26 LAB — COMPREHENSIVE METABOLIC PANEL
ALBUMIN: 3.5 g/dL
ANION GAP: 5 — AB (ref 7–16)
AST: 17 U/L
Alkaline Phosphatase: 88 U/L
BUN: 23 mg/dL — ABNORMAL HIGH
Bilirubin,Total: 0.6 mg/dL
CALCIUM: 9 mg/dL
CO2: 33 mmol/L — AB
CREATININE: 1.17 mg/dL — AB
Chloride: 94 mmol/L — ABNORMAL LOW
GFR CALC AF AMER: 59 — AB
GFR CALC NON AF AMER: 51 — AB
Glucose: 287 mg/dL — ABNORMAL HIGH
Potassium: 4.1 mmol/L
SGPT (ALT): 18 U/L
Sodium: 132 mmol/L — ABNORMAL LOW
Total Protein: 7.2 g/dL

## 2015-03-26 LAB — MAGNESIUM: MAGNESIUM: 1.8 mg/dL

## 2015-03-27 ENCOUNTER — Ambulatory Visit
Admit: 2015-03-27 | Disposition: A | Discharge status: Home / Self Care | Payer: Self-pay | Attending: Oncology | Admitting: Oncology

## 2015-04-14 NOTE — Discharge Summary (Signed)
PATIENT NAME:  Kristine Escobar, Kristine Escobar MR#:  [id number] DATE OF BIRTH:  [birth year]  DATE OF ADMISSION:  08/08/2012 DATE OF DISCHARGE:  08/11/2012  PRIMARY CARE PHYSICIAN:  Dr. Kem Kays  DISCHARGE DIAGNOSES:  1. Acute on chronic respiratory failure secondary to chronic obstructive pulmonary disease exacerbation.  2. Lung cancer stage IV.  3. Diabetes.  4. Tobacco abuse.  CONDITION: Stable.   MEDICATIONS: 1. Quinapril 40 mg p.o. daily.  2. Imitrex 25 mg p.o. 4 times a day p.r.n. for headache.  3. DuoNeb inhalation 4 times a day p.r.n. for shortness of breath.  4. Home oxygen, two liters by nasal cannula.  5. Gabapentin 300 mg p.o. at bedtime.  6. Celexa 20 mg p.o. at bedtime.  7. Alprazolam 0.25 mg  p.o. 1 to 2 times a day.  8. Proventil HFA 2 puffs 4 times a day p.r.n.  9. Humalog 4 units subcutaneous at supper, 36 units subcutaneous at bedtime, 20 units subcutaneous at breakfast, 10 units subcutaneous at lunch. New medications:  10. Nicotine patch 21 mg for 24 hours transdermal film, one patch transdermal once daily. 11. Prednisone 40 mg p.o. daily for two days, 20 mg p.o. daily for two days, and 10 mg p.o. for two days.   OXYGEN: The patient will need home oxygen 2 liters by nasal cannula.   DIET: ADA diet.   ACTIVITY: As tolerated.   FOLLOW-UP CARE: Follow up with PCP within 1 to 2 weeks. Also, the patient needs followup with  oncologist Dr. Oliva Bustard within one week.   REASON FOR ADMISSION/ HOSPITAL COURSE: Progressive shortness of breath for two days associated with cough. The patient is a 59 year old Caucasian female with a history of chronic obstructive pulmonary disease, stage IV adenocarcinoma of the lung, chronic smoker,  who presented to the ED with progressive increase in her shortness of breath for two days with whitish sputum. The patient came to the ED for further evaluation. There was no evidence of pneumonia by chest x-ray. The patient was admitted for chronic  obstructive pulmonary disease exacerbation, acute on chronic respiratory failure due to chronic obstructive pulmonary disease exacerbation. For detailed history and physical examination, please refer to the admission note dictated by Dr. Lenore Manner. The patient has been treated with IV steroids and antibiotics with Rocephin. In addition, the patient has been treated with DuoNebs every four hours. For diabetes the patient has been treated with NPH and sliding scale. She was advised to quit smoking and has been treated with a nicotine patch. After the above-mentioned treatment, the patient's symptoms  much improved. She has no complaints. Physical examination shows bilateral lung sounds are weak but no wheezing, crackles, or rales.  Vital signs are stable. She is clinically stable and will be discharged to home today. I discussed the patient's discharge plan with the patient, nurse, and case manager.   TIME SPENT: About 37 minutes.   ____________________________ Demetrios Loll, MD qc:bjt D: 08/11/2012 13:26:20 ET T: 08/13/2012 10:15:44 ET JOB#: [id number]  cc: Demetrios Loll, MD, <Dictator> Lorenza Evangelist, MD Demetrios Loll MD ELECTRONICALLY SIGNED 08/13/2012 14:54

## 2015-04-14 NOTE — H&P (Signed)
PATIENT NAME:  Kristine Escobar, Kristine Escobar MR#:  [id number] DATE OF BIRTH:  1956/12/10  DATE OF ADMISSION:  08/08/2012  PRIMARY CARE PHYSICIAN: Dr. Kem Kays   CHIEF COMPLAINT: Progressive increase in shortness of breath times two days associated with cough.   HISTORY OF PRESENT ILLNESS: Kristine Escobar is a 59 year old Caucasian female with history of chronic obstructive pulmonary disease and history of stage IV adenocarcinoma of the lung followed by Dr. Oliva Bustard. The patient is also a chronic smoker and she continues to smoke 1 pack a day. She developed progressive increase in her shortness of breath for the last two days associated with cough and whitish sputum. She also had a fever. She denies any chest pain. No hemoptysis. Evaluation in the Emergency Department is consistent with acute respiratory failure secondary to respiratory tract infection. No evidence of pneumonia by chest x-ray. There is chronic obstructive pulmonary disease exacerbation as well. The patient was admitted for further evaluation and treatment.    REVIEW OF SYSTEMS: CONSTITUTIONAL: Has fever, a few chills, mild fatigue. EYES: No blurring of vision. No double vision. ENT: No hearing impairment. No sore throat. No dysphagia. CARDIOVASCULAR: No chest pain, but she has shortness of breath. No edema. No syncope. RESPIRATORY: Reports cough and progressive increase in shortness of breath, cough with whitish sputum. No hemoptysis. GASTROINTESTINAL: No abdominal pain. No vomiting, no diarrhea. GENITOURINARY: No dysuria. No frequency of urination. MUSCULOSKELETAL: No joint pain or swelling. No muscular pain or swelling. INTEGUMENT: No skin rash. No ulcers. NEUROLOGY: No focal weakness. No seizure activity. No headache. PSYCH: No anxiety right now, but she has history of anxiety and depression. ENDOCRINE: No polyuria or polydipsia. No heat or cold intolerance.   PAST MEDICAL HISTORY:  1. Stage IV adenocarcinoma of the lung status post chemotherapy.   2. Chronic obstructive pulmonary disease.  3. Chronic respiratory failure, home oxygen dependent on 2 liters.  4. Ongoing tobacco abuse.  5. Diabetes mellitus type 2 on insulin.   PAST SURGICAL HISTORY:  1. Cholecystectomy.  2. C-section  3. Port-A-Cath insertion.   SOCIAL HABITS: Chronic smoker of 1 pack per day since age of 38. No history of alcohol or drug abuse.   SOCIAL HISTORY: She is divorced. The patient is unemployed.   FAMILY HISTORY: Negative for cancer. Her mother suffered from chronic obstructive pulmonary disease. Her father died about 25 years ago. She does not know the cause of his death.   ADMISSION MEDICATIONS:  1. Quinapril 40 mg once a day.  2. Proventil HFA 2 puffs 4 times daily p.r.n.  3. Oxygen 2 liters nasal cannula.  4. Imitrex 25 mg, 4 times daily p.r.n. for migraine headaches.  5. Hydrochlorothiazide 25 mg a day.  6. Humulin N insulin 20 units in the morning along with Humalog 6 units, again in the morning. At  bedtime she will take Humulin N insulin 36 units. Along with that she will take 4 units of Humalog. At lunch she will take it 10 units of Humalog.  7. Gabapentin 300 mg at bedtime. 8. DuoNeb p.r.n.  9. Celexa 20 mg a day. 10. Alprazolam 0.25 mg, 1-2 times a day p.r.n.  11. Advair Diskus 250/50, 1 inhalation twice a day.   ALLERGIES: Codeine causing skin rash.   PHYSICAL EXAMINATION:  VITAL SIGNS: Blood pressure 91/61, respiratory rate 28, pulse 88, temperature 100.1, oxygen saturation 94% while she is on oxygen.   GENERAL APPEARANCE: Middle-aged female who appears short of breath and tachypneic. Otherwise no other apparent distress.  HEAD: No pallor. No icterus. No cyanosis.   EARS, NOSE, THROAT: Hearing was normal. Nasal mucosa, lips, tongue were normal.   EYES: Normal iris and conjunctivae. Pupils are about 6 mm, equal, and reactive to light.   NECK: Supple. Trachea at midline. No thyromegaly. No cervical lymphadenopathy. No masses.    HEART: Normal S1, S2. No S3, S4. No murmur. No gallop. No carotid bruits.   LUNGS: Decreased breathing sounds bilaterally, prolonged expiratory phase. No rales.   ABDOMEN: Soft without tenderness. No hepatosplenomegaly. No masses. No hernias.   SKIN: No ulcers. No subcutaneous nodules.   MUSCULOSKELETAL: No joint swelling. No clubbing.   NEUROLOGIC: Cranial nerves II through XII are intact. No focal motor deficit.   PSYCHIATRIC: The patient is alert and oriented times three. Mood and affect were normal.   LABORATORY, DIAGNOSTIC, AND RADIOLOGICAL DATA: Chest x-ray showed no consolidation, no effusion. EKG showed sinus tachycardia at a rate of 104 per minute, right atrial hypertrophy, poor progression of R waves in the anterior chest leads, otherwise unremarkable EKG.   CBC showed white count of 8000, hemoglobin 16, hematocrit 47, platelet count 160. Arterial blood gas on FiO2 of 36% showed a pH of 7.36, pCO2 54, pO2 62. Serum glucose 253, BUN 24, creatinine 1.5, sodium 134, potassium 4.5. Liver function tests were normal. Troponin less than 0.02. U/A showed 9 WBCs.   ASSESSMENT:  1. Acute on chronic respiratory failure secondary to acute respiratory tract infection.  2. Acute exacerbation of chronic obstructive pulmonary disease.  3. Stage IV adenocarcinoma of the lung.  4. Diabetes mellitus type 2 on insulin, uncontrolled.  5. Tobacco abuse.  6. History of migraine headaches.  7. History of cholecystectomy, C-section, and Port-A-Cath insertion.  8.  UTI is suspected.  PLAN: Will admit the patient to the medical floor and start IV antibiotic using Rocephin 1 gram daily along with DuoNebs q. 4 hours while awake. Oxygen supplementation. Small dose of IV Solu-Medrol at 40 mg q. 8 hours. Accu-Chek and insulin sliding scale. Continue insulin NPH and Humalog as above.  I advised the patient to quit smoking. Nicotine patch 21 mg once a day. I will continue the home medications as listed  above, but I will hold her hydrochlorothiazide as there is some degree of dehydration.   TIME SPENT EVALUATING THIS PATIENT: More than 55 minutes.     ____________________________ Clovis Pu. Lenore Manner, MD amd:bjt D: 08/08/2012 23:47:33 ET T: 08/09/2012 06:56:39 ET JOB#: [id number]  cc: Clovis Pu. Lenore Manner, MD, <Dictator> Lorenza Evangelist, MD Mike Craze Irven Coe MD ELECTRONICALLY SIGNED 08/11/2012 22:22

## 2015-04-17 NOTE — Consult Note (Signed)
Reason for Visit: This 59 year old Female patient presents to the clinic for initial evaluation of  stage IV lung cancer .   Referred by Dr. Oliva Bustard.  Diagnosis:  Chief Complaint/Diagnosis   59 year old female with stage IV lung cancer (T4, N3, M1) with progressive disease and left supraclavicular fossa.  Pathology Report pathology report reviewed   Imaging Report PET/CT scans and CT scans reviewed   Referral Report clinical notes reviewed   Planned Treatment Regimen palliative radiation therapy to left supra-clavicular fossa   HPI   patient is a 59 year old female with remarkable history of stage IV lung cancer diagnosed in December 2009 initially treated with carboplatinum and Alimta. She developed progressive disease in September 2012 again treated with carboplatin and Alimta and when disease progressed in September 2013 was started on gemcitabine and carboplatinum. Her Varistrat test was positive and she is started on to see that in the November 2013.her most recent he PET/CT scan shows a small left mid lung field nodule but there is marked progression of disease with PET avid mass in the left supraclavicular fossa. She is having no significant pain or discomfort from that. She continues to smoke is on nasal oxygen.I been asked to evaluate the patient for palliative radiation therapy to her left supraclavicular fossa. She is having no cough hemoptysis chest tightness or bone pain at this time.  Past Hx:    OBESITY:    COPD:    Diabetes:    Pregnancy Induced Hypertension:    Oncology Protocol: [employer] [id number]   Amada Jupiter, RN, OCN   lung cancer:   Past, Family and Social History:  Past Medical History positive   Cardiovascular hypertension   Respiratory COPD   Endocrine diabetes mellitus   Family History positive   Family History Comments family history of lung cancer, adult onset diabetes and ovarian cancer   Social History positive   Social History  Comments greater than 40-pack-year smoking history continues to smoke no EtOH abuse history   Additional Past Medical and Surgical History seen accompanied by her sister who is my nurse today   Allergies:   Codeine: Rash  Home Meds:  Home Medications: Medication Instructions Status  ALPRAZolam 0.25 mg tablet 1 tab(s) orally 1 to 2 times a day Active  Advair Diskus 250 mcg-50 mcg powder 1 puff(s) inhaled 2 times a day x 30 days s Active  Celexa 20 mg tablet 1 tab(s) orally once a day (at bedtime) Active  Humalog 100 units/mL subcutaneous solution 6 units at breakfast Active  Humulin N 100 units/mL subcutaneous suspension 36 international unit(s) subcutaneous once a day (at bedtime) Active  quinapril 40 mg oral tablet   once a day  Active  Humalog 100 units/mL subcutaneous solution 10 unit(s) subcutaneous  at lunch Active  Humalog 100 units/mL subcutaneous solution 4 unit(s) subcutaneous at supper Active  Humulin N 100 units/mL subcutaneous suspension 20 unit(s) subcutaneous breakfast Active  promethazine 25 mg oral tablet 1 tab(s) orally every 6 hours, As Needed- for Nausea, Vomiting, #30 Active  Tarceva 150 mg oral tablet 1 tab(s) orally once a day Active  hydrochlorothiazide 25 mg oral tablet 1 tab(s) orally once a day Active  Proventil HFA 2 puff(s) inhaled 4 times a day, As Needed Active  Imitrex 25 mg oral tablet 1 tab(s) orally 4 times a day, As Needed- for Headache  Active  DuoNeb 0.5 mg-2.5 mg/3 mL inhalation solution 3 milliliter(s) inhaled 4 times a day, As Needed- for Shortness of  Breath  Active  oxygen 2 l/min by  nasal canula continuously Active  gabapentin 300 mg daily   once a day (at bedtime) Active   Review of Systems:  General negative   Performance Status (ECOG) 0   Skin negative   Breast negative   Ophthalmologic negative   ENMT negative   Respiratory and Thorax negative   Cardiovascular negative   Gastrointestinal negative   Genitourinary negative    Musculoskeletal negative   Neurological negative   Psychiatric negative   Hematology/Lymphatics negative   Endocrine negative   Allergic/Immunologic negative   Review of Systems   according to the nurse's notesPatient denies any weight loss, fatigue, weakness, fever, chills or night sweats. Patient denies any loss of vision, blurred vision. Patient denies any ringing  of the ears or hearing loss. No irregular heartbeat. Patient denies heart murmur or history of fainting. Patient denies any chest pain or pain radiating to her upper extremities. Patient denies any shortness of breath, difficulty breathing at night, cough or hemoptysis. Patient denies any swelling in the lower legs. Patient denies any nausea vomiting, vomiting of blood, or coffee ground material in the vomitus. Patient denies any stomach pain. Patient states has had normal bowel movements no significant constipation or diarrhea. Patient denies any dysuria, hematuria or significant nocturia. Patient denies any problems walking, swelling in the joints or loss of balance. Patient denies any skin changes, loss of hair or loss of weight. Patient denies any excessive worrying or anxiety or significant depression. Patient denies any problems with insomnia. Patient denies excessive thirst, polyuria, polydipsia. Patient denies any swollen glands, patient denies easy bruising or easy bleeding. Patient denies any recent infections, allergies or URI. Patient "s visual fields have not changed significantly in recent time.  Nursing Notes:  Nursing Vital Signs and Chemo Nursing Nursing Notes: *CC Vital Signs Flowsheet:   11-Jun-14 13:24  Temp Temperature 97.7  Pulse Pulse 93  Respirations Respirations 20  SBP SBP 122  DBP DBP 73  Pain Scale (0-10)  0  Current Weight (kg) (kg) 97.7  Height (cm) centimeters 64  BSA (m2) 1   Physical Exam:  General/Skin/HEENT:  General normal   Skin normal   Eyes normal   ENMT normal   Head  and Neck normal   Additional PE well-developed obese female on nasal oxygen in NAD. No cervical adenopathy is identified. She does have palpable adenopathy in the left supraclavicular fossa. It is somewhat obscured by her Port-A-Cath line in the left neck. Lungs are clear to A&P cardiac examination shows regular rate and rhythm. Abdomen is benign with no organomegaly or masses noted.   Breasts/Resp/CV/GI/GU:  Respiratory and Thorax normal   Cardiovascular normal   Gastrointestinal normal   Genitourinary normal   MS/Neuro/Psych/Lymph:  Musculoskeletal normal   Neurological normal   Lymphatics normal   Other Results:  Radiology Results: LabUnknown:    03-Jun-14 10:44, PET/CT Scan Lung Cancer Restaging  PACS Image   Nuclear Med:  PET/CT Scan Lung Cancer Restaging   REASON FOR EXAM:    Restaging Lung CA  COMMENTS:       PROCEDURE: PET - PET/CT RESTAGING LUNG CA  - May 28 2013 10:44AM     RESULT: Indication: Restaging lung cancer  Radiopharmaceutical: 11.95 mCi F18-FDG, intravenously.    Technique: Imaging was performed from the skull base to the mid-thigh   using routine PET/CT acquisition protocol.    Injection site: Left antecubital  Time of FDG injection: 0914 hours  Serum glucose: 137 mg/dL   Time of imaging: 1018 hours  Comparison studies: 01/08/2013, 01/20/2012    Findings:    HEAD AND NECK:     There is no abnormal hypermetabolic activity in the head and neck. There   is no cervical soft tissue mass or lymphadenopathy.     CHEST:    There is a left supraclavicular/infraclavicular mass measuring   approximately 5.1 x 2.9 cm with an SUV max of 8.3 and an SUV average of   4.97 . There is a hypermetabolic spiculated left upper lobe pulmonary   nodule measuring 1.2 cm with an SUV max of 11.7 and an SUV average of 7 .  The heart size is normal. There is no pericardial effusion.     There no pathologically enlarged mediastinal, hilar, or axillary lymph   nodes.      The osseous structures demonstrate no focal abnormality.     ABDOMEN/PELVIS:    The liver demonstrates no focal abnormality. The gallbladder is   surgically absent. The spleen demonstrates no focal abnormality. The   kidneys, adrenal glands, pancreas are normal. The bladder is   unremarkable.     The unopacified bowel is unremarkable. There is no pneumoperitoneum,     pneumatosis, orportal venous gas. There is no abdominal or pelvic free   fluid. There is no lymphadenopathy.     The abdominal aorta is normal in caliber.    The osseous structures are unremarkable.    IMPRESSION:     1. Persistent hypermetabolic left supraclavicular/infraclavicular fossa   mass and concerning for persistent malignancy. The left   supraclavicular/infraclavicular mass has enlarged compared with 01/08/2013   concerning for progression of malignancy.    Dictation Site: 1    Verified By: Jennette Banker, M.D., MD   Relevent Results:   Relevant Scans and Labs serial PET/CT scans are reviewed   Assessment and Plan: Impression:   progressive disease in the left subclavicular fossa and patient 5 years out from initial diagnosis of stage IV lung cancer treated with multiple chemotherapy and targeted therapies Plan:   at the stomach ahead with palliative course of radiation therapy to her left supraclavicular fossa. Basos close proximity to her esophagus and brachial plexus we'll opt for dose of 4000 cGy over 4 weeks and evaluate for response. Since besides her lung nodule this is the only site of active disease may opt to do small field boost down the road should there be persistent disease after initial treatment. Risks and benefits of treatment including skin reaction, dysphagia secondary to radiation esophagitis, alteration blood counts, and fatigue or explained in detail to the patient. Patient is going on vacation next week in about a week or so I set her up for CT simulation.  I would like to  take this opportunity to thank you for allowing me to continue to participate in this patient's care.  CC Referral:  cc: Dr. Kem Kays   Electronic Signatures: Baruch Gouty, Roda Shutters (MD)  (Signed 12-Jun-14 11:09)  Authored: HPI, Diagnosis, Past Hx, PFSH, Allergies, Home Meds, ROS, Nursing Notes, Physical Exam, Other Results, Relevent Results, Encounter Assessment and Plan, CC Referring Physician   Last Updated: 12-Jun-14 11:09 by Armstead Peaks (MD)

## 2015-04-19 NOTE — Discharge Summary (Signed)
PATIENT NAME:  Kristine Escobar, Kristine Escobar MR#:  [id number] DATE OF BIRTH:  1956/10/28  DATE OF ADMISSION:  02/23/2012 DATE OF DISCHARGE:  02/28/2012  FINAL DIAGNOSES:  1. Acute respiratory failure due to acute respiratory tract infection and previous history of chronic obstructive pulmonary disease.  2. Carcinoma of lung, stage IV disease adenocarcinoma, status post chemotherapy.  3. Insulin-dependent diabetes.  4. History of chronic tobacco abuse.  5. Moderate obesity.   DISCHARGE MEDICATIONS:  1. Xanax 0.25 mg 1 tablet 2 times a day as needed.  2. Celexa 20 mg p.o. daily. 3. Insulin Humalog 100 units/mL 36 units at bedtime.  4. Hydrochlorothiazide 25 mg 1 tablet daily.  5. Quinapril 40 mg p.o. daily.  6. DuoNeb inhaler 0.5 mg t.i.d. p.r.n.  7. Oxygen 2 liters by nasal cannula.  8. Gabapentin 300 mg 1 tablet at bedtime.   HISTORY OF PRESENT ILLNESS: Please refer to dictated note. The patient presented to our clinic with increasing shortness of breath. The patient was treated as an outpatient with IV antibiotics and did not respond, so was admitted in the hospital because of persistent hypoxia requiring oxygen for shortness of breath. The patient's past history, social and family history are on dictated note on the chart. Please refer to important lab data and x-ray data. Ventilation perfusion scan did not reveal any evidence of pulmonary embolism. CT scan of the chest without contrast revealed small pulmonary nodule, unchanged from the previous examination. During the hospital stay, the patient was treated with pulmonary consult and bronchodilator therapy. Gradually, the patient's condition improved. The patient was counseled heavily to stop smoking      and was discharged with advice to get follow-up. Overall prognosis is guarded because of underlying multiple medical illnesses.    ____________________________ Martie Lee. Josey Dettmann, MD jkc:ap D: 03/08/2012 08:52:07 ET T: 03/08/2012 15:07:33  ET JOB#: [id number]  cc: Delorise Shiner K. Oliva Bustard, MD, <Dictator> Jobe Gibbon MD ELECTRONICALLY SIGNED 03/11/2012 22:51

## 2015-04-22 ENCOUNTER — Ambulatory Visit
Admit: 2015-04-22 | Disposition: A | Discharge status: Home / Self Care | Payer: Self-pay | Attending: Oncology | Admitting: Oncology

## 2015-04-23 LAB — COMPREHENSIVE METABOLIC PANEL
ALT: 25 U/L
AST: 30 U/L
Albumin: 3.5 g/dL
Alkaline Phosphatase: 83 U/L
Anion Gap: 8 (ref 7–16)
BUN: 21 mg/dL — AB
Bilirubin,Total: 0.4 mg/dL
CHLORIDE: 95 mmol/L — AB
CO2: 31 mmol/L
Calcium, Total: 9.3 mg/dL
Creatinine: 1.37 mg/dL — ABNORMAL HIGH
EGFR (African American): 49 — ABNORMAL LOW
EGFR (Non-African Amer.): 42 — ABNORMAL LOW
Glucose: 291 mg/dL — ABNORMAL HIGH
POTASSIUM: 3.8 mmol/L
Sodium: 134 mmol/L — ABNORMAL LOW
Total Protein: 7.1 g/dL

## 2015-04-23 LAB — CBC CANCER CENTER
Basophil #: 0.1 x10 3/mm (ref 0.0–0.1)
Basophil %: 0.8 %
EOS PCT: 1.5 %
Eosinophil #: 0.1 x10 3/mm (ref 0.0–0.7)
HCT: 44.8 % (ref 35.0–47.0)
HGB: 14.9 g/dL (ref 12.0–16.0)
Lymphocyte #: 1.1 x10 3/mm (ref 1.0–3.6)
Lymphocyte %: 14.3 %
MCH: 31.5 pg (ref 26.0–34.0)
MCHC: 33.2 g/dL (ref 32.0–36.0)
MCV: 95 fL (ref 80–100)
Monocyte #: 0.5 x10 3/mm (ref 0.2–0.9)
Monocyte %: 6.9 %
Neutrophil #: 5.6 x10 3/mm (ref 1.4–6.5)
Neutrophil %: 76.5 %
PLATELETS: 166 x10 3/mm (ref 150–440)
RBC: 4.73 10*6/uL (ref 3.80–5.20)
RDW: 15.9 % — AB (ref 11.5–14.5)
WBC: 7.3 x10 3/mm (ref 3.6–11.0)

## 2015-04-23 LAB — MAGNESIUM: MAGNESIUM: 1.6 mg/dL — AB

## 2015-04-28 ENCOUNTER — Other Ambulatory Visit: Payer: Self-pay

## 2015-04-28 ENCOUNTER — Ambulatory Visit: Payer: Self-pay | Admitting: Oncology

## 2015-04-28 ENCOUNTER — Ambulatory Visit: Payer: Self-pay

## 2015-05-07 ENCOUNTER — Inpatient Hospital Stay: Payer: Medicare Other | Attending: Oncology

## 2015-05-07 ENCOUNTER — Other Ambulatory Visit: Payer: Self-pay | Admitting: Oncology

## 2015-05-07 ENCOUNTER — Encounter: Payer: Self-pay | Admitting: Oncology

## 2015-05-07 ENCOUNTER — Ambulatory Visit: Payer: Self-pay

## 2015-05-07 VITALS — BP 121/82 | HR 80 | Temp 96.1°F | Resp 20 | Ht 64.57 in | Wt 228.8 lb

## 2015-05-07 DIAGNOSIS — Z79899 Other long term (current) drug therapy: Secondary | ICD-10-CM | POA: Insufficient documentation

## 2015-05-07 DIAGNOSIS — C3412 Malignant neoplasm of upper lobe, left bronchus or lung: Secondary | ICD-10-CM

## 2015-05-07 DIAGNOSIS — C3490 Malignant neoplasm of unspecified part of unspecified bronchus or lung: Secondary | ICD-10-CM

## 2015-05-07 DIAGNOSIS — Z5111 Encounter for antineoplastic chemotherapy: Secondary | ICD-10-CM | POA: Diagnosis present

## 2015-05-07 HISTORY — DX: Malignant neoplasm of unspecified part of unspecified bronchus or lung: C34.90

## 2015-05-07 MED ORDER — SODIUM CHLORIDE 0.9 % IV SOLN
Freq: Once | INTRAVENOUS | Status: AC
  Administered 2015-05-07: 09:00:00 via INTRAVENOUS
  Filled 2015-05-07: qty 250

## 2015-05-07 MED ORDER — HEPARIN SOD (PORK) LOCK FLUSH 100 UNIT/ML IV SOLN
INTRAVENOUS | Status: AC
  Filled 2015-05-07: qty 5

## 2015-05-07 MED ORDER — SODIUM CHLORIDE 0.9 % IJ SOLN
10.0000 mL | INTRAMUSCULAR | Status: DC | PRN
  Administered 2015-05-07: 10 mL
  Filled 2015-05-07: qty 10

## 2015-05-07 MED ORDER — SODIUM CHLORIDE 0.9 % IV SOLN
300.0000 mg | Freq: Once | INTRAVENOUS | Status: AC
  Administered 2015-05-07: 300 mg via INTRAVENOUS
  Filled 2015-05-07: qty 30

## 2015-05-07 MED ORDER — HEPARIN SOD (PORK) LOCK FLUSH 100 UNIT/ML IV SOLN
500.0000 [IU] | Freq: Once | INTRAVENOUS | Status: AC | PRN
  Administered 2015-05-07: 500 [IU]

## 2015-05-15 ENCOUNTER — Other Ambulatory Visit: Payer: Self-pay | Admitting: *Deleted

## 2015-05-15 DIAGNOSIS — C3412 Malignant neoplasm of upper lobe, left bronchus or lung: Secondary | ICD-10-CM

## 2015-05-21 ENCOUNTER — Inpatient Hospital Stay (HOSPITAL_BASED_OUTPATIENT_CLINIC_OR_DEPARTMENT_OTHER): Payer: Medicare Other | Admitting: Oncology

## 2015-05-21 ENCOUNTER — Encounter: Payer: Self-pay | Admitting: Oncology

## 2015-05-21 ENCOUNTER — Inpatient Hospital Stay: Payer: Medicare Other

## 2015-05-21 ENCOUNTER — Encounter (INDEPENDENT_AMBULATORY_CARE_PROVIDER_SITE_OTHER): Payer: Self-pay

## 2015-05-21 VITALS — BP 129/85 | HR 109 | Temp 96.6°F | Wt 226.2 lb

## 2015-05-21 DIAGNOSIS — Z79899 Other long term (current) drug therapy: Secondary | ICD-10-CM

## 2015-05-21 DIAGNOSIS — N189 Chronic kidney disease, unspecified: Secondary | ICD-10-CM | POA: Diagnosis not present

## 2015-05-21 DIAGNOSIS — C3412 Malignant neoplasm of upper lobe, left bronchus or lung: Secondary | ICD-10-CM

## 2015-05-21 DIAGNOSIS — J449 Chronic obstructive pulmonary disease, unspecified: Secondary | ICD-10-CM

## 2015-05-21 DIAGNOSIS — C3492 Malignant neoplasm of unspecified part of left bronchus or lung: Secondary | ICD-10-CM

## 2015-05-21 DIAGNOSIS — E119 Type 2 diabetes mellitus without complications: Secondary | ICD-10-CM | POA: Diagnosis not present

## 2015-05-21 DIAGNOSIS — Z9981 Dependence on supplemental oxygen: Secondary | ICD-10-CM

## 2015-05-21 DIAGNOSIS — Z5111 Encounter for antineoplastic chemotherapy: Secondary | ICD-10-CM | POA: Diagnosis not present

## 2015-05-21 DIAGNOSIS — F17210 Nicotine dependence, cigarettes, uncomplicated: Secondary | ICD-10-CM

## 2015-05-21 DIAGNOSIS — Z794 Long term (current) use of insulin: Secondary | ICD-10-CM

## 2015-05-21 LAB — CBC WITH DIFFERENTIAL/PLATELET
BASOS PCT: 1 %
Basophils Absolute: 0.1 10*3/uL (ref 0–0.1)
EOS ABS: 0.1 10*3/uL (ref 0–0.7)
Eosinophils Relative: 2 %
HCT: 45.9 % (ref 35.0–47.0)
HEMOGLOBIN: 15.2 g/dL (ref 12.0–16.0)
LYMPHS ABS: 1.2 10*3/uL (ref 1.0–3.6)
LYMPHS PCT: 19 %
MCH: 30.9 pg (ref 26.0–34.0)
MCHC: 33 g/dL (ref 32.0–36.0)
MCV: 93.6 fL (ref 80.0–100.0)
MONOS PCT: 7 %
Monocytes Absolute: 0.4 10*3/uL (ref 0.2–0.9)
Neutro Abs: 4.6 10*3/uL (ref 1.4–6.5)
Neutrophils Relative %: 71 %
Platelets: 144 10*3/uL — ABNORMAL LOW (ref 150–440)
RBC: 4.9 MIL/uL (ref 3.80–5.20)
RDW: 15.6 % — ABNORMAL HIGH (ref 11.5–14.5)
WBC: 6.4 10*3/uL (ref 3.6–11.0)

## 2015-05-21 LAB — COMPREHENSIVE METABOLIC PANEL
ALT: 19 U/L (ref 14–54)
ANION GAP: 7 (ref 5–15)
AST: 19 U/L (ref 15–41)
Albumin: 3.7 g/dL (ref 3.5–5.0)
Alkaline Phosphatase: 88 U/L (ref 38–126)
BUN: 22 mg/dL — AB (ref 6–20)
CO2: 32 mmol/L (ref 22–32)
Calcium: 9 mg/dL (ref 8.9–10.3)
Chloride: 90 mmol/L — ABNORMAL LOW (ref 101–111)
Creatinine, Ser: 1.32 mg/dL — ABNORMAL HIGH (ref 0.44–1.00)
GFR calc non Af Amer: 43 mL/min — ABNORMAL LOW (ref >60)
GFR, EST AFRICAN AMERICAN: 50 mL/min — AB (ref >60)
Glucose, Bld: 448 mg/dL — ABNORMAL HIGH (ref 65–99)
Potassium: 4 mmol/L (ref 3.5–5.1)
SODIUM: 129 mmol/L — AB (ref 135–145)
Total Bilirubin: 0.6 mg/dL (ref 0.3–1.2)
Total Protein: 7.3 g/dL (ref 6.5–8.1)

## 2015-05-21 MED ORDER — SODIUM CHLORIDE 0.9 % IV SOLN
Freq: Once | INTRAVENOUS | Status: AC
  Administered 2015-05-21: 11:00:00 via INTRAVENOUS
  Filled 2015-05-21: qty 250

## 2015-05-21 MED ORDER — SODIUM CHLORIDE 0.9 % IJ SOLN
10.0000 mL | INTRAMUSCULAR | Status: DC | PRN
  Administered 2015-05-21: 10 mL
  Filled 2015-05-21: qty 10

## 2015-05-21 MED ORDER — HEPARIN SOD (PORK) LOCK FLUSH 100 UNIT/ML IV SOLN
500.0000 [IU] | Freq: Once | INTRAVENOUS | Status: AC | PRN
  Administered 2015-05-21: 500 [IU]
  Filled 2015-05-21: qty 5

## 2015-05-21 MED ORDER — SODIUM CHLORIDE 0.9 % IV SOLN
300.0000 mg | Freq: Once | INTRAVENOUS | Status: AC
  Administered 2015-05-21: 300 mg via INTRAVENOUS
  Filled 2015-05-21: qty 30

## 2015-05-21 NOTE — Progress Notes (Signed)
Patient currently smokes and does not have living will.

## 2015-05-22 ENCOUNTER — Encounter: Payer: Self-pay | Admitting: Oncology

## 2015-05-22 NOTE — Progress Notes (Signed)
[place] @ [facility] [phone]) [phone]  Fax:(336) [place]: 1956-01-06  MR#: [id number]  EHM#:[id number]  Patient Care Team: [name], MD as PCP - General (Internal Medicine)  CHIEF COMPLAINT:  Chief Complaint  Patient presents with  . Follow-up     No history exists.    Oncology Flowsheet 05/07/2015 05/21/2015  nivolumab (OPDIVO) IV 300 mg 300 mg    INTERVAL HISTORY 59 year old lady with stage IV carcinoma of lung patient is on nivolulamab.  Tolerating treatment very well.  Last PET scan shows significant improvement.  Patient has underlying COPD oxygen dependent diabetes and chronic kidney disease. noChills.  No fever.  REVIEW OF SYSTEMS:   GENERAL:  Feels good.  Active.  No fevers, sweats or weight loss. PERFORMANCE STATUS (ECOG):1 HEENT:  No visual changes, runny nose, sore throat, mouth sores or tenderness. Lungs: No shortness of breath or cough.  No hemoptysis. Cardiac:  No chest pain, palpitations, orthopnea, or PND. GI:  No nausea, vomiting, diarrhea, constipation, melena or hematochezia. GU:  No urgency, frequency, dysuria, or hematuria. Musculoskeletal:  No back pain.  No joint pain.  No muscle tenderness. Extremities:  No pain or swelling. Skin:  No rashes or skin changes. Neuro:  No headache, numbness or weakness, balance or coordination issues. Endocrine:  No diabetes, thyroid issues, hot flashes or night sweats. Psych:  No mood changes, depression or anxiety. Pain:  No focal pain. Review of systems:  All other systems reviewed and found to be negative.  As per HPI. Otherwise, a complete review of systems is negatve.  PAST MEDICAL HISTORY: Past Medical History  Diagnosis Date  . Cancer of lung 05/07/2015   Diabetes. COPD on oxygen PAST SURGICAL HISTORY: No past surgical history on file.  FAMILY HISTORY No family history of lung cancer for colon cancer, ovarian cancer GYNECOLOGIC HISTORY:  No LMP recorded.      ADVANCED DIRECTIVES: has advanced Directive   HEALTH MAINTENANCE: History  Substance Use Topics  . Smoking status: Current Every Day Smoker  . Smokeless tobacco: Not on file  . Alcohol Use: Not on file     Colonoscopy:  PAP:  Bone density:  Lipid panel:  Allergies  Allergen Reactions  . Codeine Nausea And Vomiting    Current Outpatient Prescriptions  Medication Sig Dispense Refill  . albuterol (PROVENTIL HFA;VENTOLIN HFA) 108 (90 BASE) MCG/ACT inhaler Inhale into the lungs.    . citalopram (CELEXA) 20 MG tablet Take by mouth.    . fluticasone-salmeterol (ADVAIR HFA) 115-21 MCG/ACT inhaler Inhale into the lungs.    . gabapentin (NEURONTIN) 300 MG capsule Take by mouth.    . hydrochlorothiazide (HYDRODIURIL) 25 MG tablet Take by mouth.    . insulin lispro (HUMALOG) 100 UNIT/ML injection Inject into the skin 3 (three) times daily before meals.    . Insulin NPH, Human,, Isophane, (HUMULIN N) 100 UNIT/ML Kiwkpen Inject 20 units subcutaneously every morning and 36 units every night.    . quinapril (ACCUPRIL) 40 MG tablet Take by mouth.    . ALPRAZolam (XANAX) 0.25 MG tablet Take by mouth.     No current facility-administered medications for this visit.    OBJECTIVE:  Filed Vitals:   05/21/15 0959  BP: 129/85  Pulse: 109  Temp: 96.6 F (35.9 C)     Body mass index is 38.15 kg/(m^2).    ECOG FS:1 - Symptomatic but completely ambulatory  PHYSICAL EXAM Gen. status: Patient is alert oriented not in acute  distress on oxygen Lungs: COPD.  Emphysematous chest.  Oxygen dependent. Cardiac  Tachycardia Abdomen: Soft.  No ascites.  Liver spleen not palpable Neurological system: Higher functions within normal limit.  No focal sign Lower extremity no edema Skin: No rash Lymphatic system: Palpable left supraclavicular node decreased in size  LAB RESULTS:  Appointment on 05/21/2015  Component Date Value Ref Range Status  . WBC 05/21/2015 6.4  3.6 - 11.0 K/uL Final  . RBC  05/21/2015 4.90  3.80 - 5.20 MIL/uL Final  . Hemoglobin 05/21/2015 15.2  12.0 - 16.0 g/dL Final  . HCT 05/21/2015 45.9  35.0 - 47.0 % Final  . MCV 05/21/2015 93.6  80.0 - 100.0 fL Final  . MCH 05/21/2015 30.9  26.0 - 34.0 pg Final  . MCHC 05/21/2015 33.0  32.0 - 36.0 g/dL Final  . RDW 05/21/2015 15.6* 11.5 - 14.5 % Final  . Platelets 05/21/2015 144* 150 - 440 K/uL Final  . Neutrophils Relative % 05/21/2015 71   Final  . Neutro Abs 05/21/2015 4.6  1.4 - 6.5 K/uL Final  . Lymphocytes Relative 05/21/2015 19   Final  . Lymphs Abs 05/21/2015 1.2  1.0 - 3.6 K/uL Final  . Monocytes Relative 05/21/2015 7   Final  . Monocytes Absolute 05/21/2015 0.4  0.2 - 0.9 K/uL Final  . Eosinophils Relative 05/21/2015 2   Final  . Eosinophils Absolute 05/21/2015 0.1  0 - 0.7 K/uL Final  . Basophils Relative 05/21/2015 1   Final  . Basophils Absolute 05/21/2015 0.1  0 - 0.1 K/uL Final  . Sodium 05/21/2015 129* 135 - 145 mmol/L Final  . Potassium 05/21/2015 4.0  3.5 - 5.1 mmol/L Final  . Chloride 05/21/2015 90* 101 - 111 mmol/L Final  . CO2 05/21/2015 32  22 - 32 mmol/L Final  . Glucose, Bld 05/21/2015 448* 65 - 99 mg/dL Final  . BUN 05/21/2015 22* 6 - 20 mg/dL Final  . Creatinine, Ser 05/21/2015 1.32* 0.44 - 1.00 mg/dL Final  . Calcium 05/21/2015 9.0  8.9 - 10.3 mg/dL Final  . Total Protein 05/21/2015 7.3  6.5 - 8.1 g/dL Final  . Albumin 05/21/2015 3.7  3.5 - 5.0 g/dL Final  . AST 05/21/2015 19  15 - 41 U/L Final  . ALT 05/21/2015 19  14 - 54 U/L Final  . Alkaline Phosphatase 05/21/2015 88  38 - 126 U/L Final  . Total Bilirubin 05/21/2015 0.6  0.3 - 1.2 mg/dL Final  . GFR calc non Af Amer 05/21/2015 43* >60 mL/min Final  . GFR calc Af Amer 05/21/2015 50* >60 mL/min Final   Comment: (NOTE) The eGFR has been calculated using the CKD EPI equation. This calculation has not been validated in all clinical situations. eGFR's persistently <60 mL/min signify possible Chronic Kidney Disease.   . Anion gap  05/21/2015 7  5 - 15 Final    No results found for: LABCA2 No results found for: CA199 No results found for: CEA No results found for: PSA No results found for: CA125   STUDIES: Nm Pet Image Restag (ps) Skull Base To Thigh  04/22/2015   CLINICAL DATA:  Subsequent treatment strategy for lung cancer. Restaging examination.  EXAM: NUCLEAR MEDICINE PET SKULL BASE TO THIGH  TECHNIQUE: 12.1 mCi F-18 FDG was injected intravenously. Full-ring PET imaging was performed from the skull base to thigh after the radiotracer. CT data was obtained and used for attenuation correction and anatomic localization.  FASTING BLOOD GLUCOSE:  Value: 255 mg/dl  COMPARISON:  PET-CT 12/01/2014.  FINDINGS: NECK  No hypermetabolic lymph nodes in the neck.  CHEST  Previously noted pulmonary nodules are substantially smaller than the prior study. This includes a 12 mm of left upper lobe nodule (image 78 of series 3), which demonstrates decreased metabolic activity (SUVmax = 9.3). Previously noted right upper lobe pulmonary nodule is also smaller, currently measuring 13 x 7 mm (image 76 of series 3), also with decreased metabolic activity (SUVmax = 2.0). Previously noted nodule in the inferior segment of the lingula is less well-defined and smaller, currently measuring 18 x 13 mm (image 103 of series 3) and no longer hypermetabolic (SUVmax = 1.5). Previously noted left hilar and mediastinal adenopathy has also significantly regressed, with the only remaining hypermetabolic lymph nodes including a 1.1 cm short axis low right paratracheal lymph node (SUVmax = 3.9), and a 1 cm short axis subcarinal lymph node (SUVmax = 4.1). Diffuse bronchial wall thickening with moderate centrilobular and mild paraseptal emphysema again noted. There is atherosclerosis of the thoracic aorta, the great vessels of the mediastinum and the coronary arteries, including calcified atherosclerotic plaque in the left main, left anterior descending, left circumflex  and right coronary arteries. Dilatation of the pulmonic trunk (3.7 cm in diameter). Left-sided internal jugular single-lumen porta cath with tip terminating in the distal superior vena cava.  ABDOMEN/PELVIS  No abnormal hypermetabolic activity within the liver, pancreas, adrenal glands, or spleen. No hypermetabolic lymph nodes in the abdomen or pelvis. Diffuse low attenuation throughout the hepatic parenchyma, compatible with severe hepatic steatosis. Status post cholecystectomy. 17 mm low-attenuation (-12 HU) non hypermetabolic left adrenal nodule, compatible with an adrenal adenoma. Tiny ventral hernia in the paraumbilical region containing only a small amount of omental fat incidentally noted. Atherosclerosis throughout the abdominal and pelvic vasculature, without definite aneurysm. Uterus and ovaries are unremarkable in appearance. Numerous colonic diverticulae, particularly in the descending colon and sigmoid colon, without surrounding inflammatory changes to suggest an acute diverticulitis at this time.  SKELETON  No focal hypermetabolic activity to suggest skeletal metastasis.  IMPRESSION: 1. Today's study demonstrates a positive response to therapy, with interval decrease in size and hypermetabolism of numerous pulmonary nodules bilaterally, and significant regression of previously noted left hilar and bilateral mediastinal lymphadenopathy, with some residual disease, as detailed above. 2. No new signs of distant metastatic disease in the neck, abdomen or pelvis. 3. 1.7 cm left adrenal adenoma is unchanged. 4. Dilatation of the pulmonic trunk (3.7 cm in diameter), suggestive of pulmonary arterial hypertension. 5. Severe hepatic steatosis. 6. Additional incidental findings, as above.   Electronically Signed   By: Vinnie Langton M.D.   On: 04/22/2015 10:51    ASSESSMENT: All lab data has been reviewed. Stage IV carcinoma of lung  MEDICAL DECISION MAKING:  Continue Nivolulamab   Patient expressed  understanding and was in agreement with this plan. She also understands that She can call clinic at any time with any questions, concerns, or complaints.    No matching staging information was found for the patient.  Forest Gleason, MD   05/22/2015 9:35 AM

## 2015-06-04 ENCOUNTER — Inpatient Hospital Stay: Payer: Medicare Other | Attending: Oncology

## 2015-06-04 ENCOUNTER — Telehealth: Payer: Self-pay | Admitting: *Deleted

## 2015-06-04 VITALS — BP 109/72 | HR 78 | Temp 97.9°F | Resp 22

## 2015-06-04 DIAGNOSIS — J449 Chronic obstructive pulmonary disease, unspecified: Secondary | ICD-10-CM | POA: Diagnosis not present

## 2015-06-04 DIAGNOSIS — E119 Type 2 diabetes mellitus without complications: Secondary | ICD-10-CM | POA: Diagnosis not present

## 2015-06-04 DIAGNOSIS — Z5111 Encounter for antineoplastic chemotherapy: Secondary | ICD-10-CM | POA: Insufficient documentation

## 2015-06-04 DIAGNOSIS — N189 Chronic kidney disease, unspecified: Secondary | ICD-10-CM | POA: Insufficient documentation

## 2015-06-04 DIAGNOSIS — C3490 Malignant neoplasm of unspecified part of unspecified bronchus or lung: Secondary | ICD-10-CM

## 2015-06-04 DIAGNOSIS — I129 Hypertensive chronic kidney disease with stage 1 through stage 4 chronic kidney disease, or unspecified chronic kidney disease: Secondary | ICD-10-CM | POA: Insufficient documentation

## 2015-06-04 DIAGNOSIS — R1011 Right upper quadrant pain: Secondary | ICD-10-CM | POA: Diagnosis not present

## 2015-06-04 DIAGNOSIS — Z79899 Other long term (current) drug therapy: Secondary | ICD-10-CM | POA: Diagnosis not present

## 2015-06-04 DIAGNOSIS — F17210 Nicotine dependence, cigarettes, uncomplicated: Secondary | ICD-10-CM | POA: Insufficient documentation

## 2015-06-04 DIAGNOSIS — C3412 Malignant neoplasm of upper lobe, left bronchus or lung: Secondary | ICD-10-CM

## 2015-06-04 MED ORDER — SODIUM CHLORIDE 0.9 % IV SOLN
Freq: Once | INTRAVENOUS | Status: AC
  Administered 2015-06-04: 09:00:00 via INTRAVENOUS
  Filled 2015-06-04: qty 1000

## 2015-06-04 MED ORDER — HYDROCODONE-ACETAMINOPHEN 5-325 MG PO TABS: 1.0000 | ORAL_TABLET | Freq: Four times a day (QID) | ORAL | Status: DC | PRN

## 2015-06-04 MED ORDER — HEPARIN SOD (PORK) LOCK FLUSH 100 UNIT/ML IV SOLN
500.0000 [IU] | Freq: Once | INTRAVENOUS | Status: AC | PRN
  Administered 2015-06-04: 500 [IU]
  Filled 2015-06-04: qty 5

## 2015-06-04 MED ORDER — SODIUM CHLORIDE 0.9 % IJ SOLN
10.0000 mL | INTRAMUSCULAR | Status: AC | PRN
  Filled 2015-06-04: qty 10

## 2015-06-04 MED ORDER — SODIUM CHLORIDE 0.9 % IV SOLN
300.0000 mg | Freq: Once | INTRAVENOUS | Status: AC
  Administered 2015-06-04: 300 mg via INTRAVENOUS
  Filled 2015-06-04: qty 30

## 2015-06-04 NOTE — Telephone Encounter (Signed)
Pt having new chest pain in right side of ribs and right side of sternum. Has been taking hydrocodone to help control pain. Would like refill today or hydrocodone and evaluation by MD. Per Dr. Mike Gip, pt needs to be evaluated by PCP to rule out cardiac abnormality. Instructed pt with MD recommendations, pt stated will see if pain medications help relieve symptoms and will call back if pain worsens.

## 2015-06-18 ENCOUNTER — Inpatient Hospital Stay: Payer: Medicare Other

## 2015-06-18 ENCOUNTER — Inpatient Hospital Stay (HOSPITAL_BASED_OUTPATIENT_CLINIC_OR_DEPARTMENT_OTHER): Payer: Medicare Other | Admitting: Oncology

## 2015-06-18 VITALS — BP 101/66 | HR 116 | Temp 97.3°F | Wt 226.0 lb

## 2015-06-18 DIAGNOSIS — E119 Type 2 diabetes mellitus without complications: Secondary | ICD-10-CM | POA: Diagnosis not present

## 2015-06-18 DIAGNOSIS — F17210 Nicotine dependence, cigarettes, uncomplicated: Secondary | ICD-10-CM

## 2015-06-18 DIAGNOSIS — C3412 Malignant neoplasm of upper lobe, left bronchus or lung: Secondary | ICD-10-CM

## 2015-06-18 DIAGNOSIS — I129 Hypertensive chronic kidney disease with stage 1 through stage 4 chronic kidney disease, or unspecified chronic kidney disease: Secondary | ICD-10-CM

## 2015-06-18 DIAGNOSIS — J449 Chronic obstructive pulmonary disease, unspecified: Secondary | ICD-10-CM | POA: Diagnosis not present

## 2015-06-18 DIAGNOSIS — C3490 Malignant neoplasm of unspecified part of unspecified bronchus or lung: Secondary | ICD-10-CM | POA: Diagnosis not present

## 2015-06-18 DIAGNOSIS — Z79899 Other long term (current) drug therapy: Secondary | ICD-10-CM

## 2015-06-18 DIAGNOSIS — R1011 Right upper quadrant pain: Secondary | ICD-10-CM

## 2015-06-18 DIAGNOSIS — N189 Chronic kidney disease, unspecified: Secondary | ICD-10-CM

## 2015-06-18 LAB — COMPREHENSIVE METABOLIC PANEL
ALBUMIN: 3.5 g/dL (ref 3.5–5.0)
ALT: 19 U/L (ref 14–54)
AST: 20 U/L (ref 15–41)
Alkaline Phosphatase: 82 U/L (ref 38–126)
Anion gap: 7 (ref 5–15)
BUN: 23 mg/dL — ABNORMAL HIGH (ref 6–20)
CHLORIDE: 93 mmol/L — AB (ref 101–111)
CO2: 33 mmol/L — ABNORMAL HIGH (ref 22–32)
Calcium: 9 mg/dL (ref 8.9–10.3)
Creatinine, Ser: 1.35 mg/dL — ABNORMAL HIGH (ref 0.44–1.00)
GFR calc Af Amer: 49 mL/min — ABNORMAL LOW (ref >60)
GFR calc non Af Amer: 42 mL/min — ABNORMAL LOW (ref >60)
Glucose, Bld: 248 mg/dL — ABNORMAL HIGH (ref 65–99)
POTASSIUM: 3.4 mmol/L — AB (ref 3.5–5.1)
SODIUM: 133 mmol/L — AB (ref 135–145)
Total Bilirubin: 0.5 mg/dL (ref 0.3–1.2)
Total Protein: 7.1 g/dL (ref 6.5–8.1)

## 2015-06-18 LAB — CBC WITH DIFFERENTIAL/PLATELET
Basophils Absolute: 0.1 10*3/uL (ref 0–0.1)
Basophils Relative: 1 %
Eosinophils Absolute: 0.1 10*3/uL (ref 0–0.7)
Eosinophils Relative: 2 %
HCT: 47.5 % — ABNORMAL HIGH (ref 35.0–47.0)
Hemoglobin: 15.8 g/dL (ref 12.0–16.0)
LYMPHS ABS: 1.1 10*3/uL (ref 1.0–3.6)
LYMPHS PCT: 18 %
MCH: 31.1 pg (ref 26.0–34.0)
MCHC: 33.3 g/dL (ref 32.0–36.0)
MCV: 93.2 fL (ref 80.0–100.0)
MONOS PCT: 8 %
Monocytes Absolute: 0.5 10*3/uL (ref 0.2–0.9)
NEUTROS PCT: 71 %
Neutro Abs: 4.5 10*3/uL (ref 1.4–6.5)
Platelets: 161 10*3/uL (ref 150–440)
RBC: 5.09 MIL/uL (ref 3.80–5.20)
RDW: 15.9 % — AB (ref 11.5–14.5)
WBC: 6.2 10*3/uL (ref 3.6–11.0)

## 2015-06-18 MED ORDER — NIVOLUMAB CHEMO INJECTION 100 MG/10ML
300.0000 mg | Freq: Once | INTRAVENOUS | Status: AC
  Administered 2015-06-18: 300 mg via INTRAVENOUS
  Filled 2015-06-18: qty 30

## 2015-06-18 MED ORDER — HEPARIN SOD (PORK) LOCK FLUSH 100 UNIT/ML IV SOLN
500.0000 [IU] | Freq: Once | INTRAVENOUS | Status: AC | PRN
  Administered 2015-06-18: 500 [IU]
  Filled 2015-06-18: qty 5

## 2015-06-18 MED ORDER — SODIUM CHLORIDE 0.9 % IJ SOLN
10.0000 mL | INTRAMUSCULAR | Status: DC | PRN
  Administered 2015-06-18: 10 mL
  Filled 2015-06-18: qty 10

## 2015-06-18 MED ORDER — SODIUM CHLORIDE 0.9 % IV SOLN
Freq: Once | INTRAVENOUS | Status: AC
  Administered 2015-06-18: 10:00:00 via INTRAVENOUS
  Filled 2015-06-18: qty 1000

## 2015-06-18 NOTE — Progress Notes (Signed)
Patient does not have living will.  Current smoker.

## 2015-06-20 ENCOUNTER — Encounter: Payer: Self-pay | Admitting: Oncology

## 2015-06-20 NOTE — Progress Notes (Signed)
[place] @ [facility] [phone]) [phone]  Fax:(336) [place]: 1956-08-12  MR#: [id number]  ONG#:[id number]  Patient Care Team: [name], MD as PCP - General (Internal Medicine)  CHIEF COMPLAINT:  Chief Complaint  Patient presents with  . Follow-up     No history exists.    Oncology Flowsheet 05/07/2015 05/21/2015 06/04/2015 06/18/2015  nivolumab (OPDIVO) IV 300 mg 300 mg 300 mg 300 mg    INTERVAL HISTORY 59 year old lady with stage IV carcinoma of lung patient is on nivolulamab.  Tolerating treatment very well.  Last PET scan shows significant improvement.  Patient has underlying COPD oxygen dependent diabetes and chronic kidney disease. noChills.  No fever. In June, 2016 Patient is here for ongoing evaluation and treatment consideration.  Since last evaluation patient received 1 more dose of NIVOLULAMAB.  Complaints of pain in the right upper quadrant in the right lower rib cage area.  Pain is grade 2 persistent dull aching patient does not have noted pain medication other than Tylenol.  Not associated with any breathing.  No cough or shortness of breath  REVIEW OF SYSTEMS:   GENERAL:  Feels good.  Active.  No fevers, sweats or weight loss. PERFORMANCE STATUS (ECOG):1 HEENT:  No visual changes, runny nose, sore throat, mouth sores or tenderness. Lungs: No shortness of breath or cough.  No hemoptysis. Cardiac:  No chest pain, palpitations, orthopnea, or PND. GI:  No nausea, vomiting, diarrhea, constipation, melena or hematochezia. GU:  No urgency, frequency, dysuria, or hematuria. Musculoskeletal:  No back pain.  No joint pain.  No muscle tenderness. Extremities:  No pain or swelling. Skin:  No rashes or skin changes. Neuro:  No headache, numbness or weakness, balance or coordination issues. Endocrine:  No diabetes, thyroid issues, hot flashes or night sweats. Psych:  No mood changes, depression or anxiety. Pain:  Right lower quadrant pain.   Right upper chest wall pain. Review of systems:  All other systems reviewed and found to be negative.  As per HPI. Otherwise, a complete review of systems is negatve.  PAST MEDICAL HISTORY: Past Medical History  Diagnosis Date  . Cancer of lung 05/07/2015   Diabetes. COPD on oxygen PAST SURGICAL HISTORY: No past surgical history on file.  FAMILY HISTORY No family history of lung cancer for colon cancer, ovarian cancer GYNECOLOGIC HISTORY:  No LMP recorded.     ADVANCED DIRECTIVES: Patient does not have any advanced healthcare directive. Information has been given.  HEALTH MAINTENANCE: History  Substance Use Topics  . Smoking status: Current Every Day Smoker  . Smokeless tobacco: Not on file  . Alcohol Use: Not on file      Allergies  Allergen Reactions  . Codeine Nausea And Vomiting    Current Outpatient Prescriptions  Medication Sig Dispense Refill  . albuterol (PROVENTIL HFA;VENTOLIN HFA) 108 (90 BASE) MCG/ACT inhaler Inhale into the lungs.    . ALPRAZolam (XANAX) 0.25 MG tablet Take by mouth.    . citalopram (CELEXA) 20 MG tablet Take by mouth.    . fluticasone-salmeterol (ADVAIR HFA) 115-21 MCG/ACT inhaler Inhale into the lungs.    . gabapentin (NEURONTIN) 300 MG capsule Take by mouth.    . hydrochlorothiazide (HYDRODIURIL) 25 MG tablet Take by mouth.    Marland Kitchen HYDROcodone-acetaminophen (NORCO/VICODIN) 5-325 MG per tablet Take 1 tablet by mouth every 6 (six) hours as needed for moderate pain. 30 tablet 0  . insulin lispro (HUMALOG) 100 UNIT/ML injection Inject into the skin 3 (  three) times daily before meals.    . Insulin NPH, Human,, Isophane, (HUMULIN N) 100 UNIT/ML Kiwkpen Inject 20 units subcutaneously every morning and 36 units every night.    . quinapril (ACCUPRIL) 40 MG tablet Take by mouth.     No current facility-administered medications for this visit.   Facility-Administered Medications Ordered in Other Visits  Medication Dose Route Frequency Provider  Last Rate Last Dose  . sodium chloride 0.9 % injection 10 mL  10 mL Intracatheter PRN Forest Gleason, MD        OBJECTIVE:  Filed Vitals:   06/18/15 0923  BP: 101/66  Pulse: 116  Temp: 97.3 F (36.3 C)     Body mass index is 38.11 kg/(m^2).    ECOG FS:1 - Symptomatic but completely ambulatory  PHYSICAL EXAM Gen. status: Patient is alert oriented not in acute distress on oxygen Lungs: COPD.  Emphysematous chest.  Oxygen dependent. Cardiac  Tachycardia Abdomen: Soft.  No ascites.  Liver spleen not palpable Neurological system: Higher functions within normal limit.  No focal sign Lower extremity no edema Skin: No rash Lymphatic system: Palpable left supraclavicular node decreased in size  LAB RESULTS:  Appointment on 06/18/2015  Component Date Value Ref Range Status  . WBC 06/18/2015 6.2  3.6 - 11.0 K/uL Final  . RBC 06/18/2015 5.09  3.80 - 5.20 MIL/uL Final  . Hemoglobin 06/18/2015 15.8  12.0 - 16.0 g/dL Final  . HCT 06/18/2015 47.5* 35.0 - 47.0 % Final  . MCV 06/18/2015 93.2  80.0 - 100.0 fL Final  . MCH 06/18/2015 31.1  26.0 - 34.0 pg Final  . MCHC 06/18/2015 33.3  32.0 - 36.0 g/dL Final  . RDW 06/18/2015 15.9* 11.5 - 14.5 % Final  . Platelets 06/18/2015 161  150 - 440 K/uL Final  . Neutrophils Relative % 06/18/2015 71   Final  . Neutro Abs 06/18/2015 4.5  1.4 - 6.5 K/uL Final  . Lymphocytes Relative 06/18/2015 18   Final  . Lymphs Abs 06/18/2015 1.1  1.0 - 3.6 K/uL Final  . Monocytes Relative 06/18/2015 8   Final  . Monocytes Absolute 06/18/2015 0.5  0.2 - 0.9 K/uL Final  . Eosinophils Relative 06/18/2015 2   Final  . Eosinophils Absolute 06/18/2015 0.1  0 - 0.7 K/uL Final  . Basophils Relative 06/18/2015 1   Final  . Basophils Absolute 06/18/2015 0.1  0 - 0.1 K/uL Final  . Sodium 06/18/2015 133* 135 - 145 mmol/L Final  . Potassium 06/18/2015 3.4* 3.5 - 5.1 mmol/L Final  . Chloride 06/18/2015 93* 101 - 111 mmol/L Final  . CO2 06/18/2015 33* 22 - 32 mmol/L Final  .  Glucose, Bld 06/18/2015 248* 65 - 99 mg/dL Final  . BUN 06/18/2015 23* 6 - 20 mg/dL Final  . Creatinine, Ser 06/18/2015 1.35* 0.44 - 1.00 mg/dL Final  . Calcium 06/18/2015 9.0  8.9 - 10.3 mg/dL Final  . Total Protein 06/18/2015 7.1  6.5 - 8.1 g/dL Final  . Albumin 06/18/2015 3.5  3.5 - 5.0 g/dL Final  . AST 06/18/2015 20  15 - 41 U/L Final  . ALT 06/18/2015 19  14 - 54 U/L Final  . Alkaline Phosphatase 06/18/2015 82  38 - 126 U/L Final  . Total Bilirubin 06/18/2015 0.5  0.3 - 1.2 mg/dL Final  . GFR calc non Af Amer 06/18/2015 42* >60 mL/min Final  . GFR calc Af Amer 06/18/2015 49* >60 mL/min Final   Comment: (NOTE) The eGFR has been calculated  using the CKD EPI equation. This calculation has not been validated in all clinical situations. eGFR's persistently <60 mL/min signify possible Chronic Kidney Disease.   . Anion gap 06/18/2015 7  5 - 15 Final    STUDIES: No results found.  ASSESSMENT: Stage IV carcinoma of lung New-onset of right serous drainage or pain  MEDICAL DECISION MAKING:  Continue Nivolulamab  A staging PET scanng  Total duration of visit was 45 minutes.  50% or more time was spent in counseling patient and family regarding prognosis and options of treatment and available resources Patient expressed understanding and was in agreement with this plan. She also understands that She can call clinic at any time with any questions, concerns, or complaints.    No matching staging information was found for the patient.  Forest Gleason, MD   06/20/2015 11:04 AM

## 2015-06-22 ENCOUNTER — Other Ambulatory Visit: Payer: Self-pay | Admitting: *Deleted

## 2015-06-22 MED ORDER — ALPRAZOLAM 0.25 MG PO TABS: 0.2500 mg | ORAL_TABLET | Freq: Two times a day (BID) | ORAL | Status: DC | PRN

## 2015-06-22 NOTE — Telephone Encounter (Signed)
Faxed signed rx to pharmacy.

## 2015-07-02 ENCOUNTER — Ambulatory Visit
Admission: RE | Admit: 2015-07-02 | Discharge: 2015-07-02 | Disposition: A | Discharge status: Home / Self Care | Payer: Medicare Other | Source: Ambulatory Visit | Attending: Oncology | Admitting: Oncology

## 2015-07-02 DIAGNOSIS — J449 Chronic obstructive pulmonary disease, unspecified: Secondary | ICD-10-CM | POA: Diagnosis not present

## 2015-07-02 DIAGNOSIS — I10 Essential (primary) hypertension: Secondary | ICD-10-CM | POA: Diagnosis not present

## 2015-07-02 DIAGNOSIS — C3412 Malignant neoplasm of upper lobe, left bronchus or lung: Secondary | ICD-10-CM | POA: Insufficient documentation

## 2015-07-02 DIAGNOSIS — R911 Solitary pulmonary nodule: Secondary | ICD-10-CM | POA: Diagnosis not present

## 2015-07-02 DIAGNOSIS — N183 Chronic kidney disease, stage 3 (moderate): Secondary | ICD-10-CM | POA: Diagnosis not present

## 2015-07-02 DIAGNOSIS — R590 Localized enlarged lymph nodes: Secondary | ICD-10-CM | POA: Insufficient documentation

## 2015-07-02 DIAGNOSIS — Z885 Allergy status to narcotic agent status: Secondary | ICD-10-CM | POA: Insufficient documentation

## 2015-07-02 DIAGNOSIS — R1011 Right upper quadrant pain: Secondary | ICD-10-CM | POA: Diagnosis present

## 2015-07-02 DIAGNOSIS — E119 Type 2 diabetes mellitus without complications: Secondary | ICD-10-CM | POA: Insufficient documentation

## 2015-07-02 DIAGNOSIS — N289 Disorder of kidney and ureter, unspecified: Secondary | ICD-10-CM | POA: Insufficient documentation

## 2015-07-02 LAB — GLUCOSE, CAPILLARY: Glucose-Capillary: 155 mg/dL — ABNORMAL HIGH (ref 65–99)

## 2015-07-02 MED ORDER — FLUDEOXYGLUCOSE F - 18 (FDG) INJECTION
11.2900 | Freq: Once | INTRAVENOUS | Status: AC | PRN
  Administered 2015-07-02: 11.29 via INTRAVENOUS

## 2015-07-06 ENCOUNTER — Inpatient Hospital Stay: Payer: Medicare Other | Admitting: Oncology

## 2015-07-06 ENCOUNTER — Inpatient Hospital Stay: Payer: Medicare Other

## 2015-07-07 ENCOUNTER — Inpatient Hospital Stay: Payer: Medicare Other

## 2015-07-07 ENCOUNTER — Inpatient Hospital Stay: Payer: Medicare Other | Attending: Oncology | Admitting: Oncology

## 2015-07-07 ENCOUNTER — Encounter: Payer: Self-pay | Admitting: Oncology

## 2015-07-07 VITALS — BP 139/82 | HR 106 | Temp 97.5°F | Wt 224.9 lb

## 2015-07-07 DIAGNOSIS — C3490 Malignant neoplasm of unspecified part of unspecified bronchus or lung: Secondary | ICD-10-CM | POA: Diagnosis not present

## 2015-07-07 DIAGNOSIS — Z79899 Other long term (current) drug therapy: Secondary | ICD-10-CM | POA: Diagnosis not present

## 2015-07-07 DIAGNOSIS — C3412 Malignant neoplasm of upper lobe, left bronchus or lung: Secondary | ICD-10-CM

## 2015-07-07 DIAGNOSIS — Z9981 Dependence on supplemental oxygen: Secondary | ICD-10-CM | POA: Diagnosis not present

## 2015-07-07 DIAGNOSIS — E119 Type 2 diabetes mellitus without complications: Secondary | ICD-10-CM | POA: Insufficient documentation

## 2015-07-07 DIAGNOSIS — Z5111 Encounter for antineoplastic chemotherapy: Secondary | ICD-10-CM | POA: Insufficient documentation

## 2015-07-07 DIAGNOSIS — R1011 Right upper quadrant pain: Secondary | ICD-10-CM | POA: Insufficient documentation

## 2015-07-07 DIAGNOSIS — J449 Chronic obstructive pulmonary disease, unspecified: Secondary | ICD-10-CM | POA: Diagnosis not present

## 2015-07-07 LAB — CBC WITH DIFFERENTIAL/PLATELET
BASOS PCT: 1 %
Basophils Absolute: 0.1 10*3/uL (ref 0–0.1)
Eosinophils Absolute: 0.1 10*3/uL (ref 0–0.7)
Eosinophils Relative: 2 %
HCT: 45.6 % (ref 35.0–47.0)
Hemoglobin: 14.8 g/dL (ref 12.0–16.0)
Lymphocytes Relative: 20 %
Lymphs Abs: 1.2 10*3/uL (ref 1.0–3.6)
MCH: 31.1 pg (ref 26.0–34.0)
MCHC: 32.5 g/dL (ref 32.0–36.0)
MCV: 95.7 fL (ref 80.0–100.0)
Monocytes Absolute: 0.5 10*3/uL (ref 0.2–0.9)
Monocytes Relative: 8 %
NEUTROS PCT: 69 %
Neutro Abs: 4.3 10*3/uL (ref 1.4–6.5)
PLATELETS: 150 10*3/uL (ref 150–440)
RBC: 4.76 MIL/uL (ref 3.80–5.20)
RDW: 15.9 % — ABNORMAL HIGH (ref 11.5–14.5)
WBC: 6.2 10*3/uL (ref 3.6–11.0)

## 2015-07-07 MED ORDER — SODIUM CHLORIDE 0.9 % IJ SOLN
10.0000 mL | INTRAMUSCULAR | Status: DC | PRN
  Administered 2015-07-07: 10 mL
  Filled 2015-07-07: qty 10

## 2015-07-07 MED ORDER — HEPARIN SOD (PORK) LOCK FLUSH 100 UNIT/ML IV SOLN
INTRAVENOUS | Status: AC
  Filled 2015-07-07: qty 5

## 2015-07-07 MED ORDER — HEPARIN SOD (PORK) LOCK FLUSH 100 UNIT/ML IV SOLN
500.0000 [IU] | Freq: Once | INTRAVENOUS | Status: AC | PRN
  Administered 2015-07-07: 500 [IU]

## 2015-07-07 MED ORDER — NIVOLUMAB CHEMO INJECTION 100 MG/10ML
300.0000 mg | Freq: Once | INTRAVENOUS | Status: AC
  Administered 2015-07-07: 300 mg via INTRAVENOUS
  Filled 2015-07-07: qty 30

## 2015-07-07 MED ORDER — SODIUM CHLORIDE 0.9 % IV SOLN
Freq: Once | INTRAVENOUS | Status: AC
  Administered 2015-07-07: 10:00:00 via INTRAVENOUS
  Filled 2015-07-07: qty 1000

## 2015-07-07 NOTE — Progress Notes (Signed)
Patient does not have living will.  Declined invitation.   Currently smokes.

## 2015-07-07 NOTE — Addendum Note (Signed)
Addended by: Telford Nab on: 07/07/2015 10:32 AM   Modules accepted: Orders

## 2015-07-07 NOTE — Progress Notes (Signed)
[place] @ [facility] Telephone:(336) [phone]  Fax:(336) [place]: Aug 30, 1956  MR#: [id number]  UKG#:[id number]  Patient Care Team: [name], MD as PCP - General (Internal Medicine)  CHIEF COMPLAINT:  Chief Complaint  Patient presents with  . Follow-up     No history exists.    Oncology Flowsheet 05/07/2015 05/21/2015 06/04/2015 06/18/2015  nivolumab (OPDIVO) IV 300 mg 300 mg 300 mg 300 mg    INTERVAL HISTORY 59 year old lady with stage IV carcinoma of lung patient is on nivolulamab.  Tolerating treatment very well.  Last PET scan shows significant improvement.  Patient has underlying COPD oxygen dependent diabetes and chronic kidney disease. noChills.  No fever. In June, 2016 Patient is here for ongoing evaluation and treatment consideration.  Since last evaluation patient received 1 more dose of NIVOLULAMAB.  Complaints of pain in the right upper quadrant in the right lower rib cage area.  Pain is grade 2 persistent dull aching patient does not have noted pain medication other than Tylenol.  Not associated with any breathing.  No cough or shortness of breath July 07, 2015 Patient is here for further follow-up regarding right upper quadrant pain which is intermittent and not getting any worse.  Had a PET scan done.  No shortness of breath appetite remains stable.  No cough.  No hemoptysis.  No chest pain.  REVIEW OF SYSTEMS:   GENERAL:  Feels good.  Active.  No fevers, sweats or weight loss. PERFORMANCE STATUS (ECOG):1 HEENT:  No visual changes, runny nose, sore throat, mouth sores or tenderness. Lungs: No shortness of breath or cough.  No hemoptysis. Cardiac:  No chest pain, palpitations, orthopnea, or PND. GI:  No nausea, vomiting, diarrhea, constipation, melena or hematochezia. GU:  No urgency, frequency, dysuria, or hematuria. Musculoskeletal:  No back pain.  No joint pain.  No muscle tenderness. Extremities:  No pain or swelling. Skin:  No rashes  or skin changes. Neuro:  No headache, numbness or weakness, balance or coordination issues. Endocrine:  No diabetes, thyroid issues, hot flashes or night sweats. Psych:  No mood changes, depression or anxiety. Pain:  Right lower quadrant pain.  Right upper chest wall pain.  Does not require any pain medication takes  Occasional  Tylenol Review of systems:  All other systems reviewed and found to be negative.  As per HPI. Otherwise, a complete review of systems is negatve.  PAST MEDICAL HISTORY: Past Medical History  Diagnosis Date  . Cancer of lung 05/07/2015   Diabetes. COPD on oxygen PAST SURGICAL HISTORY: No past surgical history on file.  FAMILY HISTORY No family history of lung cancer for colon cancer, ovarian cancer GYNECOLOGIC HISTORY:  No LMP recorded.     ADVANCED DIRECTIVES: Patient does not have any advanced healthcare directive. Information has been given.  HEALTH MAINTENANCE: History  Substance Use Topics  . Smoking status: Current Every Day Smoker  . Smokeless tobacco: Not on file  . Alcohol Use: Not on file      Allergies  Allergen Reactions  . Codeine Nausea And Vomiting    Current Outpatient Prescriptions  Medication Sig Dispense Refill  . albuterol (PROVENTIL HFA;VENTOLIN HFA) 108 (90 BASE) MCG/ACT inhaler Inhale into the lungs.    . ALPRAZolam (XANAX) 0.25 MG tablet Take 1 tablet (0.25 mg total) by mouth 2 (two) times daily as needed for anxiety. 60 tablet 1  . citalopram (CELEXA) 20 MG tablet Take by mouth.    . fluticasone-salmeterol (ADVAIR  HFA) 115-21 MCG/ACT inhaler Inhale into the lungs.    . gabapentin (NEURONTIN) 300 MG capsule Take by mouth.    . hydrochlorothiazide (HYDRODIURIL) 25 MG tablet Take by mouth.    Marland Kitchen HYDROcodone-acetaminophen (NORCO/VICODIN) 5-325 MG per tablet Take 1 tablet by mouth every 6 (six) hours as needed for moderate pain. 30 tablet 0  . insulin lispro (HUMALOG) 100 UNIT/ML injection Inject into the skin 3 (three)  times daily before meals.    . Insulin NPH, Human,, Isophane, (HUMULIN N) 100 UNIT/ML Kiwkpen Inject 20 units subcutaneously every morning and 36 units every night.    . quinapril (ACCUPRIL) 40 MG tablet Take by mouth.     No current facility-administered medications for this visit.   Facility-Administered Medications Ordered in Other Visits  Medication Dose Route Frequency Provider Last Rate Last Dose  . sodium chloride 0.9 % injection 10 mL  10 mL Intracatheter PRN Forest Gleason, MD        OBJECTIVE:  Filed Vitals:   07/07/15 0853  BP: 139/82  Pulse: 106  Temp: 97.5 F (36.4 C)     Body mass index is 37.92 kg/(m^2).    ECOG FS:1 - Symptomatic but completely ambulatory  PHYSICAL EXAM Gen. status: Patient is alert oriented not in acute distress on oxygen Lungs: COPD.  Emphysematous chest.  Oxygen dependent. Cardiac  Tachycardia Abdomen: Soft.  No ascites.  Liver spleen not palpable Neurological system: Higher functions within normal limit.  No focal sign Lower extremity no edema Skin: No rash Lymphatic system: Palpable left supraclavicular node decreased in size  LAB RESULTS:  No visits with results within 3 Day(s) from this visit. Latest known visit with results is:  Hospital Outpatient Visit on 07/02/2015  Component Date Value Ref Range Status  . Glucose-Capillary 07/02/2015 155* 65 - 99 mg/dL Final    STUDIES: Nm Pet Image Restag (ps) Skull Base To Thigh  07/02/2015   CLINICAL DATA:  Subsequent Treatment strategy for Lung cancer.  EXAM: NUCLEAR MEDICINE PET SKULL BASE TO THIGH  TECHNIQUE: 11.29 mCi F-18 FDG was injected intravenously. Full-ring PET imaging was performed from the skull base to thigh after the radiotracer. CT data was obtained and used for attenuation correction and anatomic localization.  FASTING BLOOD GLUCOSE:  Value: 155 mg/dl  COMPARISON:  04/22/2015  FINDINGS: NECK  No hypermetabolic lymph nodes in the neck.  CHEST  Again noted are multi focal  hypermetabolic pulmonary nodules. Index left upper lobe pulmonary nodule measures 1.3 cm and has an SUV max equal to 18. Previously this measured 1.2 cm and had an SUV max equal to 9.3. Index right upper lobe pulmonary nodule measures 7 mm and has an SUV max equal to 1.39. Previously this measured 1.3 cm and had an SUV max equal to 2.0. Index lesion within the lingula measures 1.6 cm and has an SUV max equal to 1.0. This is unchanged from previous exam. No new pulmonary nodules identified.  The index sub- carinal node measures 9 mm and has an SUV max equal to 3.5. Previously this measured 1 cm and had an SUV max equal to the 4.1. Right paratracheal lymph node measures 1 cm and has an SUV max equal to 4.18. Previously this 1.1 cm and had an SUV max equal to 3.9.  ABDOMEN/PELVIS  No abnormal hypermetabolic activity within the liver, pancreas, adrenal glands, or spleen. Diffuse hepatic steatosis noted. Left adrenal adenoma is again noted measuring 1.8 cm No hypermetabolic lymph nodes in the abdomen or pelvis.  SKELETON  No focal hypermetabolic activity to suggest skeletal metastasis.  IMPRESSION: 1. The dominant lesion within the left upper lobe is increased in size and degree of FDG uptake. The SUV max on today's study is equal to 18. Previously 9.3. 2. Index nodule in the right upper lobe is decreased in size and degree of FDG uptake. 3. Similar appearance of hypermetabolic right paratracheal and sub- carinal adenopathy.   Electronically Signed   By: Kerby Moors M.D.   On: 07/02/2015 11:07    ASSESSMENT: Stage IV carcinoma of lung Right upper quadrant pain etiology is not clear  MEDICAL DECISION MAKING:  Continue Nivolulamab  Scan has been reviewed and reviewed with the patient.  Reviewed independently I do not see any evidence of clear-cut progression disease there are no new areas Continue chemotherapy if pain gets worse and MRI scan of the spine would be done and MRI of liver can be done to assess in  the occult metastases    No matching staging information was found for the patient.  Forest Gleason, MD   07/07/2015 9:33 AM

## 2015-07-07 NOTE — Progress Notes (Signed)
Patient has completed her survey for the [employer] research study.  I have collected the survey from her and issued her a $100 gift card provided by [employer].  I thanked patient for her participation.  I spend approximately 5 minutes with patient.

## 2015-07-21 ENCOUNTER — Inpatient Hospital Stay: Payer: Medicare Other | Admitting: Oncology

## 2015-07-21 ENCOUNTER — Inpatient Hospital Stay: Payer: Medicare Other

## 2015-07-23 ENCOUNTER — Inpatient Hospital Stay: Payer: Medicare Other

## 2015-07-23 ENCOUNTER — Inpatient Hospital Stay: Payer: Medicare Other | Admitting: Oncology

## 2015-07-27 ENCOUNTER — Other Ambulatory Visit: Payer: Self-pay | Admitting: *Deleted

## 2015-07-27 ENCOUNTER — Telehealth: Payer: Self-pay | Admitting: *Deleted

## 2015-07-27 ENCOUNTER — Inpatient Hospital Stay: Payer: Medicare Other

## 2015-07-27 ENCOUNTER — Encounter: Payer: Self-pay | Admitting: Oncology

## 2015-07-27 ENCOUNTER — Inpatient Hospital Stay (HOSPITAL_BASED_OUTPATIENT_CLINIC_OR_DEPARTMENT_OTHER): Payer: Medicare Other | Admitting: Oncology

## 2015-07-27 ENCOUNTER — Inpatient Hospital Stay: Payer: Medicare Other | Attending: Oncology

## 2015-07-27 VITALS — BP 130/78 | HR 78 | Temp 96.8°F | Resp 20

## 2015-07-27 VITALS — BP 129/77 | HR 72 | Temp 96.5°F | Wt 222.4 lb

## 2015-07-27 DIAGNOSIS — Z79899 Other long term (current) drug therapy: Secondary | ICD-10-CM | POA: Insufficient documentation

## 2015-07-27 DIAGNOSIS — Z6837 Body mass index (BMI) 37.0-37.9, adult: Secondary | ICD-10-CM | POA: Insufficient documentation

## 2015-07-27 DIAGNOSIS — C3490 Malignant neoplasm of unspecified part of unspecified bronchus or lung: Secondary | ICD-10-CM

## 2015-07-27 DIAGNOSIS — R1011 Right upper quadrant pain: Secondary | ICD-10-CM | POA: Insufficient documentation

## 2015-07-27 DIAGNOSIS — C3412 Malignant neoplasm of upper lobe, left bronchus or lung: Secondary | ICD-10-CM

## 2015-07-27 DIAGNOSIS — Z9981 Dependence on supplemental oxygen: Secondary | ICD-10-CM | POA: Diagnosis not present

## 2015-07-27 DIAGNOSIS — J449 Chronic obstructive pulmonary disease, unspecified: Secondary | ICD-10-CM | POA: Insufficient documentation

## 2015-07-27 DIAGNOSIS — E119 Type 2 diabetes mellitus without complications: Secondary | ICD-10-CM | POA: Insufficient documentation

## 2015-07-27 DIAGNOSIS — Z5111 Encounter for antineoplastic chemotherapy: Secondary | ICD-10-CM | POA: Diagnosis not present

## 2015-07-27 DIAGNOSIS — F17210 Nicotine dependence, cigarettes, uncomplicated: Secondary | ICD-10-CM | POA: Insufficient documentation

## 2015-07-27 LAB — COMPREHENSIVE METABOLIC PANEL
ALBUMIN: 3.6 g/dL (ref 3.5–5.0)
ALK PHOS: 79 U/L (ref 38–126)
ALT: 20 U/L (ref 14–54)
ANION GAP: 5 (ref 5–15)
AST: 21 U/L (ref 15–41)
BILIRUBIN TOTAL: 0.7 mg/dL (ref 0.3–1.2)
BUN: 21 mg/dL — AB (ref 6–20)
CHLORIDE: 96 mmol/L — AB (ref 101–111)
CO2: 33 mmol/L — ABNORMAL HIGH (ref 22–32)
Calcium: 9 mg/dL (ref 8.9–10.3)
Creatinine, Ser: 1.3 mg/dL — ABNORMAL HIGH (ref 0.44–1.00)
GFR calc Af Amer: 51 mL/min — ABNORMAL LOW (ref >60)
GFR, EST NON AFRICAN AMERICAN: 44 mL/min — AB (ref >60)
Glucose, Bld: 193 mg/dL — ABNORMAL HIGH (ref 65–99)
POTASSIUM: 4.2 mmol/L (ref 3.5–5.1)
SODIUM: 134 mmol/L — AB (ref 135–145)
Total Protein: 7.4 g/dL (ref 6.5–8.1)

## 2015-07-27 LAB — CBC WITH DIFFERENTIAL/PLATELET
BASOS PCT: 1 %
Basophils Absolute: 0.1 10*3/uL (ref 0–0.1)
Eosinophils Absolute: 0.1 10*3/uL (ref 0–0.7)
Eosinophils Relative: 2 %
HCT: 47.9 % — ABNORMAL HIGH (ref 35.0–47.0)
HEMOGLOBIN: 15.8 g/dL (ref 12.0–16.0)
LYMPHS ABS: 1.6 10*3/uL (ref 1.0–3.6)
Lymphocytes Relative: 22 %
MCH: 31.1 pg (ref 26.0–34.0)
MCHC: 32.9 g/dL (ref 32.0–36.0)
MCV: 94.6 fL (ref 80.0–100.0)
MONO ABS: 0.6 10*3/uL (ref 0.2–0.9)
Monocytes Relative: 8 %
NEUTROS ABS: 4.8 10*3/uL (ref 1.4–6.5)
Neutrophils Relative %: 67 %
Platelets: 173 10*3/uL (ref 150–440)
RBC: 5.06 MIL/uL (ref 3.80–5.20)
RDW: 15.7 % — ABNORMAL HIGH (ref 11.5–14.5)
WBC: 7.2 10*3/uL (ref 3.6–11.0)

## 2015-07-27 MED ORDER — SODIUM CHLORIDE 0.9 % IJ SOLN
10.0000 mL | INTRAMUSCULAR | Status: DC | PRN
  Administered 2015-07-27: 10 mL via INTRAVENOUS
  Filled 2015-07-27: qty 10

## 2015-07-27 MED ORDER — SODIUM CHLORIDE 0.9 % IV SOLN
Freq: Once | INTRAVENOUS | Status: AC
  Administered 2015-07-27: 11:00:00 via INTRAVENOUS
  Filled 2015-07-27: qty 1000

## 2015-07-27 MED ORDER — HEPARIN SOD (PORK) LOCK FLUSH 100 UNIT/ML IV SOLN
500.0000 [IU] | Freq: Once | INTRAVENOUS | Status: AC
Start: 2015-07-27 — End: 2015-07-27
  Administered 2015-07-27: 500 [IU] via INTRAVENOUS

## 2015-07-27 MED ORDER — HYDROCODONE-ACETAMINOPHEN 5-325 MG PO TABS: 1.0000 | ORAL_TABLET | Freq: Four times a day (QID) | ORAL | Status: DC | PRN

## 2015-07-27 MED ORDER — HEPARIN SOD (PORK) LOCK FLUSH 100 UNIT/ML IV SOLN
INTRAVENOUS | Status: AC
  Filled 2015-07-27: qty 5

## 2015-07-27 MED ORDER — SODIUM CHLORIDE 0.9 % IJ SOLN
10.0000 mL | INTRAMUSCULAR | Status: DC | PRN
  Filled 2015-07-27: qty 10

## 2015-07-27 MED ORDER — SODIUM CHLORIDE 0.9 % IV SOLN
300.0000 mg | Freq: Once | INTRAVENOUS | Status: AC
  Administered 2015-07-27: 300 mg via INTRAVENOUS
  Filled 2015-07-27: qty 30

## 2015-07-27 MED ORDER — SODIUM CHLORIDE 0.9 % IV SOLN: 3.0000 mg/kg | Freq: Once | INTRAVENOUS | Status: DC

## 2015-07-27 NOTE — Progress Notes (Signed)
Patient does not have living will.  Current every day smoker. Patient requesting refill for Alprazolam and Vicodin.

## 2015-07-27 NOTE — Progress Notes (Signed)
[place] @ [facility] Telephone:(336) [phone]  Fax:(336) [place]: Feb 20, 1956  MR#: [id number]  GYI#:[id number]  Patient Care Team: [name], MD as PCP - General (Internal Medicine)  CHIEF COMPLAINT:  Chief Complaint  Patient presents with  . Follow-up     No history exists.    Oncology Flowsheet 05/07/2015 05/21/2015 06/04/2015 06/18/2015 07/07/2015  nivolumab (OPDIVO) IV 300 mg 300 mg 300 mg 300 mg 300 mg    INTERVAL HISTORY 59 year old lady with stage IV carcinoma of lung patient is on nivolulamab.  Tolerating treatment very well.  Last PET scan shows significant improvement.  Patient has underlying COPD oxygen dependent diabetes and chronic kidney disease. noChills.  No fever. In June, 2016 Patient is here for ongoing evaluation and treatment consideration.  Since last evaluation patient received 1 more dose of NIVOLULAMAB.  Complaints of pain in the right upper quadrant in the right lower rib cage area.  Pain is grade 2 persistent dull aching patient does not have noted pain medication other than Tylenol.  Not associated with any breathing.  No cough or shortness of breath July 07, 2015 Patient is here for further follow-up regarding right upper quadrant pain which is intermittent and not getting any worse.  Had a PET scan done.  No shortness of breath appetite remains stable.  No cough.  No hemoptysis.  No chest pain. July 27, 2015 Patient is here for continuation of  NIVOLULAMAB  Patient forgot last treatment had episode of headache nausea vomiting lasting 24 hours.  Patient does get migraine headache No cough or shortness of breath patient is on oxygen and continues to smoke  REVIEW OF SYSTEMS:   GENERAL:  Feels good.  Active.  No fevers, sweats or weight loss. PERFORMANCE STATUS (ECOG):1 HEENT:  No visual changes, runny nose, sore throat, mouth sores or tenderness. Lungs: No shortness of breath or cough.  No hemoptysis. Cardiac:  No chest pain,  palpitations, orthopnea, or PND. GI:  No nausea, vomiting, diarrhea, constipation, melena or hematochezia. GU:  No urgency, frequency, dysuria, or hematuria. Musculoskeletal:  No back pain.  No joint pain.  No muscle tenderness. Extremities:  No pain or swelling. Skin:  No rashes or skin changes. Neuro:  No headache, numbness or weakness, balance or coordination issues. Endocrine:  No diabetes, thyroid issues, hot flashes or night sweats. Psych:  No mood changes, depression or anxiety. Pain:  Right lower quadrant pain.  Right upper chest wall pain.  Does not require any pain medication takes  Occasional  Tylenol Review of systems:  All other systems reviewed and found to be negative.  As per HPI. Otherwise, a complete review of systems is negatve.  PAST MEDICAL HISTORY: Past Medical History  Diagnosis Date  . Cancer of lung 05/07/2015   Diabetes. COPD on oxygen PAST SURGICAL HISTORY: No past surgical history on file.  FAMILY HISTORY No family history of lung cancer for colon cancer, ovarian cancer     ADVANCED DIRECTIVES: Patient does not have any advanced healthcare directive. Information has been given.  HEALTH MAINTENANCE: History  Substance Use Topics  . Smoking status: Current Every Day Smoker  . Smokeless tobacco: Not on file  . Alcohol Use: Not on file      Allergies  Allergen Reactions  . Codeine Nausea And Vomiting    Current Outpatient Prescriptions  Medication Sig Dispense Refill  . albuterol (PROVENTIL HFA;VENTOLIN HFA) 108 (90 BASE) MCG/ACT inhaler Inhale into the lungs.    Marland Kitchen  ALPRAZolam (XANAX) 0.25 MG tablet Take 1 tablet (0.25 mg total) by mouth 2 (two) times daily as needed for anxiety. 60 tablet 1  . citalopram (CELEXA) 20 MG tablet Take by mouth.    . fluticasone-salmeterol (ADVAIR HFA) 115-21 MCG/ACT inhaler Inhale into the lungs.    . gabapentin (NEURONTIN) 300 MG capsule Take by mouth.    . hydrochlorothiazide (HYDRODIURIL) 25 MG tablet Take  by mouth.    Marland Kitchen HYDROcodone-acetaminophen (NORCO/VICODIN) 5-325 MG per tablet Take 1 tablet by mouth every 6 (six) hours as needed for moderate pain. 30 tablet 0  . insulin lispro (HUMALOG) 100 UNIT/ML injection Inject into the skin 3 (three) times daily before meals.    . Insulin NPH, Human,, Isophane, (HUMULIN N) 100 UNIT/ML Kiwkpen Inject 20 units subcutaneously every morning and 36 units every night.    . quinapril (ACCUPRIL) 40 MG tablet Take by mouth.     No current facility-administered medications for this visit.   Facility-Administered Medications Ordered in Other Visits  Medication Dose Route Frequency Provider Last Rate Last Dose  . sodium chloride 0.9 % injection 10 mL  10 mL Intracatheter PRN Forest Gleason, MD        OBJECTIVE:  Filed Vitals:   07/27/15 0931  BP: 129/77  Pulse: 72  Temp: 96.5 F (35.8 C)     Body mass index is 37.51 kg/(m^2).    ECOG FS:1 - Symptomatic but completely ambulatory  PHYSICAL EXAM Gen. status: Patient is alert oriented not in acute distress on oxygen Lungs: COPD.  Emphysematous chest.  Oxygen dependent. Cardiac  Tachycardia Abdomen: Soft.  No ascites.  Liver spleen not palpable Neurological system: Higher functions within normal limit.  No focal sign Lower extremity no edema Skin: No rash Lymphatic system: Palpable left supraclavicular node decreased in size  LAB RESULTS:  Appointment on 07/27/2015  Component Date Value Ref Range Status  . Sodium 07/27/2015 134* 135 - 145 mmol/L Final  . Potassium 07/27/2015 4.2  3.5 - 5.1 mmol/L Final  . Chloride 07/27/2015 96* 101 - 111 mmol/L Final  . CO2 07/27/2015 33* 22 - 32 mmol/L Final  . Glucose, Bld 07/27/2015 193* 65 - 99 mg/dL Final  . BUN 07/27/2015 21* 6 - 20 mg/dL Final  . Creatinine, Ser 07/27/2015 1.30* 0.44 - 1.00 mg/dL Final  . Calcium 07/27/2015 9.0  8.9 - 10.3 mg/dL Final  . Total Protein 07/27/2015 7.4  6.5 - 8.1 g/dL Final  . Albumin 07/27/2015 3.6  3.5 - 5.0 g/dL Final  .  AST 07/27/2015 21  15 - 41 U/L Final  . ALT 07/27/2015 20  14 - 54 U/L Final  . Alkaline Phosphatase 07/27/2015 79  38 - 126 U/L Final  . Total Bilirubin 07/27/2015 0.7  0.3 - 1.2 mg/dL Final  . GFR calc non Af Amer 07/27/2015 44* >60 mL/min Final  . GFR calc Af Amer 07/27/2015 51* >60 mL/min Final   Comment: (NOTE) The eGFR has been calculated using the CKD EPI equation. This calculation has not been validated in all clinical situations. eGFR's persistently <60 mL/min signify possible Chronic Kidney Disease.   . Anion gap 07/27/2015 5  5 - 15 Final  . WBC 07/27/2015 7.2  3.6 - 11.0 K/uL Final  . RBC 07/27/2015 5.06  3.80 - 5.20 MIL/uL Final  . Hemoglobin 07/27/2015 15.8  12.0 - 16.0 g/dL Final  . HCT 07/27/2015 47.9* 35.0 - 47.0 % Final  . MCV 07/27/2015 94.6  80.0 - 100.0 fL Final  .  MCH 07/27/2015 31.1  26.0 - 34.0 pg Final  . MCHC 07/27/2015 32.9  32.0 - 36.0 g/dL Final  . RDW 07/27/2015 15.7* 11.5 - 14.5 % Final  . Platelets 07/27/2015 173  150 - 440 K/uL Final  . Neutrophils Relative % 07/27/2015 67   Final  . Neutro Abs 07/27/2015 4.8  1.4 - 6.5 K/uL Final  . Lymphocytes Relative 07/27/2015 22   Final  . Lymphs Abs 07/27/2015 1.6  1.0 - 3.6 K/uL Final  . Monocytes Relative 07/27/2015 8   Final  . Monocytes Absolute 07/27/2015 0.6  0.2 - 0.9 K/uL Final  . Eosinophils Relative 07/27/2015 2   Final  . Eosinophils Absolute 07/27/2015 0.1  0 - 0.7 K/uL Final  . Basophils Relative 07/27/2015 1   Final  . Basophils Absolute 07/27/2015 0.1  0 - 0.1 K/uL Final    STUDIES: Nm Pet Image Restag (ps) Skull Base To Thigh  07/02/2015   CLINICAL DATA:  Subsequent Treatment strategy for Lung cancer.  EXAM: NUCLEAR MEDICINE PET SKULL BASE TO THIGH  TECHNIQUE: 11.29 mCi F-18 FDG was injected intravenously. Full-ring PET imaging was performed from the skull base to thigh after the radiotracer. CT data was obtained and used for attenuation correction and anatomic localization.  FASTING BLOOD  GLUCOSE:  Value: 155 mg/dl  COMPARISON:  04/22/2015  FINDINGS: NECK  No hypermetabolic lymph nodes in the neck.  CHEST  Again noted are multi focal hypermetabolic pulmonary nodules. Index left upper lobe pulmonary nodule measures 1.3 cm and has an SUV max equal to 18. Previously this measured 1.2 cm and had an SUV max equal to 9.3. Index right upper lobe pulmonary nodule measures 7 mm and has an SUV max equal to 1.39. Previously this measured 1.3 cm and had an SUV max equal to 2.0. Index lesion within the lingula measures 1.6 cm and has an SUV max equal to 1.0. This is unchanged from previous exam. No new pulmonary nodules identified.  The index sub- carinal node measures 9 mm and has an SUV max equal to 3.5. Previously this measured 1 cm and had an SUV max equal to the 4.1. Right paratracheal lymph node measures 1 cm and has an SUV max equal to 4.18. Previously this 1.1 cm and had an SUV max equal to 3.9.  ABDOMEN/PELVIS  No abnormal hypermetabolic activity within the liver, pancreas, adrenal glands, or spleen. Diffuse hepatic steatosis noted. Left adrenal adenoma is again noted measuring 1.8 cm No hypermetabolic lymph nodes in the abdomen or pelvis.  SKELETON  No focal hypermetabolic activity to suggest skeletal metastasis.  IMPRESSION: 1. The dominant lesion within the left upper lobe is increased in size and degree of FDG uptake. The SUV max on today's study is equal to 18. Previously 9.3. 2. Index nodule in the right upper lobe is decreased in size and degree of FDG uptake. 3. Similar appearance of hypermetabolic right paratracheal and sub- carinal adenopathy.   Electronically Signed   By: Kerby Moors M.D.   On: 07/02/2015 11:07    ASSESSMENT: Stage IV carcinoma of lung Obesity BMI is 37 Chronic smoker Diabetes On oxygen  MEDICAL DECISION MAKING:  Right upper quadrant pain has improved continue Nivolulamab   No matching staging information was found for the patient.  Forest Gleason, MD    07/27/2015 10:17 AM

## 2015-07-27 NOTE — Telephone Encounter (Signed)
Call in refill per pt request. Spoke to [facility] court and gave 2 refills xanax0.25 mg per choksi ok

## 2015-08-10 ENCOUNTER — Inpatient Hospital Stay: Payer: Medicare Other

## 2015-08-10 VITALS — BP 107/65 | HR 91 | Temp 96.8°F | Resp 20

## 2015-08-10 DIAGNOSIS — C3412 Malignant neoplasm of upper lobe, left bronchus or lung: Secondary | ICD-10-CM

## 2015-08-10 DIAGNOSIS — Z5111 Encounter for antineoplastic chemotherapy: Secondary | ICD-10-CM | POA: Diagnosis not present

## 2015-08-10 DIAGNOSIS — C3490 Malignant neoplasm of unspecified part of unspecified bronchus or lung: Secondary | ICD-10-CM

## 2015-08-10 LAB — CBC WITH DIFFERENTIAL/PLATELET
BASOS ABS: 0.1 10*3/uL (ref 0–0.1)
BASOS PCT: 1 %
Eosinophils Absolute: 0.1 10*3/uL (ref 0–0.7)
Eosinophils Relative: 1 %
HEMATOCRIT: 44.9 % (ref 35.0–47.0)
HEMOGLOBIN: 15.2 g/dL (ref 12.0–16.0)
LYMPHS PCT: 20 %
Lymphs Abs: 1.4 10*3/uL (ref 1.0–3.6)
MCH: 31.3 pg (ref 26.0–34.0)
MCHC: 34 g/dL (ref 32.0–36.0)
MCV: 92.3 fL (ref 80.0–100.0)
Monocytes Absolute: 0.5 10*3/uL (ref 0.2–0.9)
Monocytes Relative: 7 %
NEUTROS ABS: 4.9 10*3/uL (ref 1.4–6.5)
Neutrophils Relative %: 71 %
Platelets: 150 10*3/uL (ref 150–440)
RBC: 4.87 MIL/uL (ref 3.80–5.20)
RDW: 15.6 % — ABNORMAL HIGH (ref 11.5–14.5)
WBC: 6.9 10*3/uL (ref 3.6–11.0)

## 2015-08-10 MED ORDER — SODIUM CHLORIDE 0.9 % IJ SOLN
10.0000 mL | INTRAMUSCULAR | Status: DC | PRN
  Administered 2015-08-10: 10 mL
  Filled 2015-08-10: qty 10

## 2015-08-10 MED ORDER — SODIUM CHLORIDE 0.9 % IV SOLN
300.0000 mg | Freq: Once | INTRAVENOUS | Status: AC
  Administered 2015-08-10: 300 mg via INTRAVENOUS
  Filled 2015-08-10: qty 30

## 2015-08-10 MED ORDER — HEPARIN SOD (PORK) LOCK FLUSH 100 UNIT/ML IV SOLN
500.0000 [IU] | Freq: Once | INTRAVENOUS | Status: AC | PRN
  Administered 2015-08-10: 500 [IU]
  Filled 2015-08-10: qty 5

## 2015-08-10 MED ORDER — SODIUM CHLORIDE 0.9 % IV SOLN
Freq: Once | INTRAVENOUS | Status: AC
  Administered 2015-08-10: 10:00:00 via INTRAVENOUS
  Filled 2015-08-10: qty 1000

## 2015-08-14 DIAGNOSIS — E119 Type 2 diabetes mellitus without complications: Secondary | ICD-10-CM | POA: Insufficient documentation

## 2015-08-24 ENCOUNTER — Inpatient Hospital Stay: Payer: Medicare Other

## 2015-08-24 ENCOUNTER — Inpatient Hospital Stay (HOSPITAL_BASED_OUTPATIENT_CLINIC_OR_DEPARTMENT_OTHER): Payer: Medicare Other | Admitting: Oncology

## 2015-08-24 VITALS — BP 108/69 | HR 81 | Temp 96.6°F | Wt 222.0 lb

## 2015-08-24 DIAGNOSIS — C3412 Malignant neoplasm of upper lobe, left bronchus or lung: Secondary | ICD-10-CM

## 2015-08-24 DIAGNOSIS — C3490 Malignant neoplasm of unspecified part of unspecified bronchus or lung: Secondary | ICD-10-CM | POA: Diagnosis not present

## 2015-08-24 DIAGNOSIS — Z5111 Encounter for antineoplastic chemotherapy: Secondary | ICD-10-CM | POA: Diagnosis not present

## 2015-08-24 DIAGNOSIS — R1011 Right upper quadrant pain: Secondary | ICD-10-CM

## 2015-08-24 DIAGNOSIS — Z79899 Other long term (current) drug therapy: Secondary | ICD-10-CM | POA: Diagnosis not present

## 2015-08-24 LAB — CBC WITH DIFFERENTIAL/PLATELET
BASOS ABS: 0.1 10*3/uL (ref 0–0.1)
Basophils Relative: 1 %
EOS ABS: 0.1 10*3/uL (ref 0–0.7)
EOS PCT: 2 %
HCT: 44.7 % (ref 35.0–47.0)
HEMOGLOBIN: 15.3 g/dL (ref 12.0–16.0)
Lymphocytes Relative: 21 %
Lymphs Abs: 1.4 10*3/uL (ref 1.0–3.6)
MCH: 31.3 pg (ref 26.0–34.0)
MCHC: 34.2 g/dL (ref 32.0–36.0)
MCV: 91.4 fL (ref 80.0–100.0)
Monocytes Absolute: 0.5 10*3/uL (ref 0.2–0.9)
Monocytes Relative: 8 %
Neutro Abs: 4.3 10*3/uL (ref 1.4–6.5)
Neutrophils Relative %: 68 %
PLATELETS: 156 10*3/uL (ref 150–440)
RBC: 4.88 MIL/uL (ref 3.80–5.20)
RDW: 15.2 % — ABNORMAL HIGH (ref 11.5–14.5)
WBC: 6.4 10*3/uL (ref 3.6–11.0)

## 2015-08-24 LAB — COMPREHENSIVE METABOLIC PANEL
ALBUMIN: 3.5 g/dL (ref 3.5–5.0)
ALK PHOS: 77 U/L (ref 38–126)
ALT: 16 U/L (ref 14–54)
AST: 18 U/L (ref 15–41)
Anion gap: 7 (ref 5–15)
BUN: 21 mg/dL — ABNORMAL HIGH (ref 6–20)
CALCIUM: 8.7 mg/dL — AB (ref 8.9–10.3)
CHLORIDE: 93 mmol/L — AB (ref 101–111)
CO2: 32 mmol/L (ref 22–32)
Creatinine, Ser: 1.29 mg/dL — ABNORMAL HIGH (ref 0.44–1.00)
GFR calc non Af Amer: 44 mL/min — ABNORMAL LOW (ref >60)
GFR, EST AFRICAN AMERICAN: 51 mL/min — AB (ref >60)
GLUCOSE: 253 mg/dL — AB (ref 65–99)
Potassium: 3.9 mmol/L (ref 3.5–5.1)
SODIUM: 132 mmol/L — AB (ref 135–145)
Total Bilirubin: 0.4 mg/dL (ref 0.3–1.2)
Total Protein: 7.1 g/dL (ref 6.5–8.1)

## 2015-08-24 MED ORDER — SODIUM CHLORIDE 0.9 % IJ SOLN
10.0000 mL | INTRAMUSCULAR | Status: DC | PRN
  Filled 2015-08-24: qty 10

## 2015-08-24 MED ORDER — SODIUM CHLORIDE 0.9 % IV SOLN
Freq: Once | INTRAVENOUS | Status: AC
  Administered 2015-08-24: 11:00:00 via INTRAVENOUS
  Filled 2015-08-24: qty 1000

## 2015-08-24 MED ORDER — HEPARIN SOD (PORK) LOCK FLUSH 100 UNIT/ML IV SOLN
INTRAVENOUS | Status: AC
  Filled 2015-08-24: qty 5

## 2015-08-24 MED ORDER — SODIUM CHLORIDE 0.9 % IV SOLN
300.0000 mg | Freq: Once | INTRAVENOUS | Status: AC
  Administered 2015-08-24: 300 mg via INTRAVENOUS
  Filled 2015-08-24: qty 30

## 2015-08-24 MED ORDER — HEPARIN SOD (PORK) LOCK FLUSH 100 UNIT/ML IV SOLN
500.0000 [IU] | Freq: Once | INTRAVENOUS | Status: AC
  Administered 2015-08-24: 500 [IU] via INTRAVENOUS

## 2015-08-24 NOTE — Progress Notes (Signed)
Patient does not have living will.  Currently smokes.

## 2015-08-26 ENCOUNTER — Encounter: Payer: Self-pay | Admitting: Oncology

## 2015-08-26 ENCOUNTER — Other Ambulatory Visit: Payer: Self-pay | Admitting: Oncology

## 2015-08-28 NOTE — Progress Notes (Signed)
[place] @ [facility] [phone]  Fax:([phone]) [place]: 03-31-1956  MR#: [id number]  EOF#:[id number]  Patient Care Team: [name], MD as PCP - General (Internal Medicine)  CHIEF COMPLAINT:  Chief Complaint  Patient presents with  . Follow-up     No history exists.    Oncology Flowsheet 05/21/2015 06/04/2015 06/18/2015 07/07/2015 07/27/2015 08/10/2015 08/24/2015  nivolumab (OPDIVO) IV 300 mg 300 mg 300 mg 300 mg 300 mg 300 mg 300 mg    INTERVAL HISTORY 59 year old lady with stage IV carcinoma of lung patient is on nivolulamab.  Tolerating treatment very well.  Last PET scan shows significant improvement.  Patient has underlying COPD oxygen dependent diabetes and chronic kidney disease. noChills.  No fever. In June, 2016 Patient is here for ongoing evaluation and treatment consideration.  Since last evaluation patient received 1 more dose of NIVOLULAMAB.  Complaints of pain in the right upper quadrant in the right lower rib cage area.  Pain is grade 2 persistent dull aching patient does not have noted pain medication other than Tylenol.  Not associated with any breathing.  No cough or shortness of breath July 07, 2015 Patient is here for further follow-up regarding right upper quadrant pain which is intermittent and not getting any worse.  Had a PET scan done.  No shortness of breath appetite remains stable.  No cough.  No hemoptysis.  No chest pain. July 27, 2015 Patient is here for continuation of  NIVOLULAMAB  Patient forgot last treatment had episode of headache nausea vomiting lasting 24 hours.  Patient does get migraine headache No cough or shortness of breath patient is on oxygen and continues to smoke August 24, 2015 Patient is here for continuation of chemotherapy for carcinoma of lung.  Overall patient has been doing very well.  Her diabetes is still out of control.  Patient continues to smoke.  No nausea.  No vomiting.  No cough or shortness  of breath gaining weight patient is on NIVOLULAMAB No diarrhea or rash   REVIEW OF SYSTEMS:   GENERAL:  Feels good.  Active.  No fevers, sweats or weight loss. PERFORMANCE STATUS (ECOG):1 HEENT:  No visual changes, runny nose, sore throat, mouth sores or tenderness. Lungs: No shortness of breath or cough.  No hemoptysis. Cardiac:  No chest pain, palpitations, orthopnea, or PND. GI:  No nausea, vomiting, diarrhea, constipation, melena or hematochezia. GU:  No urgency, frequency, dysuria, or hematuria. Musculoskeletal:  No back pain.  No joint pain.  No muscle tenderness. Extremities:  No pain or swelling. Skin:  No rashes or skin changes. Neuro:  No headache, numbness or weakness, balance or coordination issues. Endocrine:  No diabetes, thyroid issues, hot flashes or night sweats. Psych:  No mood changes, depression or anxiety. Pain:  Right lower quadrant pain.  Right upper chest wall pain.  Does not require any pain medication takes  Occasional  Tylenol Review of systems:  All other systems reviewed and found to be negative.  As per HPI. Otherwise, a complete review of systems is negatve.  PAST MEDICAL HISTORY: Past Medical History  Diagnosis Date  . Cancer of lung 05/07/2015   Diabetes. COPD on oxygen PAST SURGICAL HISTORY: No past surgical history on file.  FAMILY HISTORY No family history of lung cancer for colon cancer, ovarian cancer     ADVANCED DIRECTIVES: Patient does not have any advanced healthcare directive. Information has been given.  HEALTH MAINTENANCE: Social History  Substance Use  Topics  . Smoking status: Current Every Day Smoker  . Smokeless tobacco: None  . Alcohol Use: None      Allergies  Allergen Reactions  . Codeine Nausea And Vomiting    Current Outpatient Prescriptions  Medication Sig Dispense Refill  . albuterol (PROVENTIL HFA;VENTOLIN HFA) 108 (90 BASE) MCG/ACT inhaler Inhale into the lungs.    . ALPRAZolam (XANAX) 0.25 MG tablet  Take 1 tablet (0.25 mg total) by mouth 2 (two) times daily as needed for anxiety. 60 tablet 1  . citalopram (CELEXA) 20 MG tablet Take by mouth.    . fluticasone-salmeterol (ADVAIR HFA) 115-21 MCG/ACT inhaler Inhale into the lungs.    . gabapentin (NEURONTIN) 300 MG capsule Take by mouth.    . hydrochlorothiazide (HYDRODIURIL) 25 MG tablet Take by mouth.    Marland Kitchen HYDROcodone-acetaminophen (NORCO/VICODIN) 5-325 MG per tablet Take 1 tablet by mouth every 6 (six) hours as needed for moderate pain. 30 tablet 0  . insulin lispro (HUMALOG) 100 UNIT/ML injection Inject into the skin 3 (three) times daily before meals.    . Insulin NPH, Human,, Isophane, (HUMULIN N) 100 UNIT/ML Kiwkpen Inject 20 units subcutaneously every morning and 36 units every night.    . quinapril (ACCUPRIL) 40 MG tablet Take by mouth.     No current facility-administered medications for this visit.   Facility-Administered Medications Ordered in Other Visits  Medication Dose Route Frequency Provider Last Rate Last Dose  . sodium chloride 0.9 % injection 10 mL  10 mL Intracatheter PRN Forest Gleason, MD        OBJECTIVE:  Filed Vitals:   08/24/15 1046  BP: 108/69  Pulse: 81  Temp: 96.6 F (35.9 C)     Body mass index is 37.44 kg/(m^2).    ECOG FS:1 - Symptomatic but completely ambulatory  PHYSICAL EXAM Gen. status: Patient is alert oriented not in acute distress on oxygen Lungs: COPD.  Emphysematous chest.  Oxygen dependent. Cardiac  Tachycardia Abdomen: Soft.  No ascites.  Liver spleen not palpable Neurological system: Higher functions within normal limit.  No focal sign Lower extremity no edema Skin: No rash Lymphatic system: Palpable left supraclavicular node decreased in size  LAB RESULTS:  Appointment on 08/24/2015  Component Date Value Ref Range Status  . WBC 08/24/2015 6.4  3.6 - 11.0 K/uL Final  . RBC 08/24/2015 4.88  3.80 - 5.20 MIL/uL Final  . Hemoglobin 08/24/2015 15.3  12.0 - 16.0 g/dL Final  . HCT  08/24/2015 44.7  35.0 - 47.0 % Final  . MCV 08/24/2015 91.4  80.0 - 100.0 fL Final  . MCH 08/24/2015 31.3  26.0 - 34.0 pg Final  . MCHC 08/24/2015 34.2  32.0 - 36.0 g/dL Final  . RDW 08/24/2015 15.2* 11.5 - 14.5 % Final  . Platelets 08/24/2015 156  150 - 440 K/uL Final  . Neutrophils Relative % 08/24/2015 68   Final  . Neutro Abs 08/24/2015 4.3  1.4 - 6.5 K/uL Final  . Lymphocytes Relative 08/24/2015 21   Final  . Lymphs Abs 08/24/2015 1.4  1.0 - 3.6 K/uL Final  . Monocytes Relative 08/24/2015 8   Final  . Monocytes Absolute 08/24/2015 0.5  0.2 - 0.9 K/uL Final  . Eosinophils Relative 08/24/2015 2   Final  . Eosinophils Absolute 08/24/2015 0.1  0 - 0.7 K/uL Final  . Basophils Relative 08/24/2015 1   Final  . Basophils Absolute 08/24/2015 0.1  0 - 0.1 K/uL Final  . Sodium 08/24/2015 132* 135 -  145 mmol/L Final  . Potassium 08/24/2015 3.9  3.5 - 5.1 mmol/L Final  . Chloride 08/24/2015 93* 101 - 111 mmol/L Final  . CO2 08/24/2015 32  22 - 32 mmol/L Final  . Glucose, Bld 08/24/2015 253* 65 - 99 mg/dL Final  . BUN 08/24/2015 21* 6 - 20 mg/dL Final  . Creatinine, Ser 08/24/2015 1.29* 0.44 - 1.00 mg/dL Final  . Calcium 08/24/2015 8.7* 8.9 - 10.3 mg/dL Final  . Total Protein 08/24/2015 7.1  6.5 - 8.1 g/dL Final  . Albumin 08/24/2015 3.5  3.5 - 5.0 g/dL Final  . AST 08/24/2015 18  15 - 41 U/L Final  . ALT 08/24/2015 16  14 - 54 U/L Final  . Alkaline Phosphatase 08/24/2015 77  38 - 126 U/L Final  . Total Bilirubin 08/24/2015 0.4  0.3 - 1.2 mg/dL Final  . GFR calc non Af Amer 08/24/2015 44* >60 mL/min Final  . GFR calc Af Amer 08/24/2015 51* >60 mL/min Final   Comment: (NOTE) The eGFR has been calculated using the CKD EPI equation. This calculation has not been validated in all clinical situations. eGFR's persistently <60 mL/min signify possible Chronic Kidney Disease.   . Anion gap 08/24/2015 7  5 - 15 Final    STUDIES: No results found.  ASSESSMENT: Stage IV carcinoma of  lung Obesity BMI is 37 Chronic smoker Diabetes On oxygen  MEDICAL DECISION MAKING:  Right upper quadrant pain has improved continue Nivolulamab All lab data has been reviewed   No matching staging information was found for the patient.  Forest Gleason, MD   08/28/2015 10:52 AM

## 2015-09-07 ENCOUNTER — Inpatient Hospital Stay: Payer: Medicare Other | Attending: Oncology

## 2015-09-07 VITALS — BP 115/70 | HR 85 | Temp 96.6°F | Resp 20

## 2015-09-07 DIAGNOSIS — E669 Obesity, unspecified: Secondary | ICD-10-CM | POA: Insufficient documentation

## 2015-09-07 DIAGNOSIS — Z794 Long term (current) use of insulin: Secondary | ICD-10-CM | POA: Insufficient documentation

## 2015-09-07 DIAGNOSIS — E1122 Type 2 diabetes mellitus with diabetic chronic kidney disease: Secondary | ICD-10-CM | POA: Diagnosis not present

## 2015-09-07 DIAGNOSIS — J449 Chronic obstructive pulmonary disease, unspecified: Secondary | ICD-10-CM | POA: Insufficient documentation

## 2015-09-07 DIAGNOSIS — N189 Chronic kidney disease, unspecified: Secondary | ICD-10-CM | POA: Insufficient documentation

## 2015-09-07 DIAGNOSIS — Z9981 Dependence on supplemental oxygen: Secondary | ICD-10-CM | POA: Insufficient documentation

## 2015-09-07 DIAGNOSIS — Z6837 Body mass index (BMI) 37.0-37.9, adult: Secondary | ICD-10-CM | POA: Diagnosis not present

## 2015-09-07 DIAGNOSIS — C3412 Malignant neoplasm of upper lobe, left bronchus or lung: Secondary | ICD-10-CM

## 2015-09-07 DIAGNOSIS — C3490 Malignant neoplasm of unspecified part of unspecified bronchus or lung: Secondary | ICD-10-CM | POA: Insufficient documentation

## 2015-09-07 DIAGNOSIS — Z79899 Other long term (current) drug therapy: Secondary | ICD-10-CM | POA: Insufficient documentation

## 2015-09-07 DIAGNOSIS — F17210 Nicotine dependence, cigarettes, uncomplicated: Secondary | ICD-10-CM | POA: Diagnosis not present

## 2015-09-07 MED ORDER — SODIUM CHLORIDE 0.9 % IV SOLN
300.0000 mg | Freq: Once | INTRAVENOUS | Status: AC
  Administered 2015-09-07: 300 mg via INTRAVENOUS
  Filled 2015-09-07: qty 30

## 2015-09-07 MED ORDER — SODIUM CHLORIDE 0.9 % IJ SOLN
10.0000 mL | INTRAMUSCULAR | Status: DC | PRN
Start: 2015-09-07 — End: 2015-09-07
  Administered 2015-09-07: 10 mL
  Filled 2015-09-07: qty 10

## 2015-09-07 MED ORDER — HEPARIN SOD (PORK) LOCK FLUSH 100 UNIT/ML IV SOLN
500.0000 [IU] | Freq: Once | INTRAVENOUS | Status: AC | PRN
  Administered 2015-09-07: 500 [IU]
  Filled 2015-09-07: qty 5

## 2015-09-07 MED ORDER — SODIUM CHLORIDE 0.9 % IV SOLN
Freq: Once | INTRAVENOUS | Status: AC
  Administered 2015-09-07: 10:00:00 via INTRAVENOUS
  Filled 2015-09-07: qty 1000

## 2015-09-17 ENCOUNTER — Ambulatory Visit: Payer: Medicare Other

## 2015-09-17 ENCOUNTER — Encounter (INDEPENDENT_AMBULATORY_CARE_PROVIDER_SITE_OTHER): Payer: Self-pay

## 2015-09-21 ENCOUNTER — Inpatient Hospital Stay: Payer: Medicare Other

## 2015-09-21 ENCOUNTER — Inpatient Hospital Stay (HOSPITAL_BASED_OUTPATIENT_CLINIC_OR_DEPARTMENT_OTHER): Payer: Medicare Other | Admitting: Oncology

## 2015-09-21 ENCOUNTER — Encounter: Payer: Self-pay | Admitting: Oncology

## 2015-09-21 VITALS — BP 107/68 | HR 111 | Temp 96.8°F | Wt 218.4 lb

## 2015-09-21 DIAGNOSIS — F17210 Nicotine dependence, cigarettes, uncomplicated: Secondary | ICD-10-CM | POA: Diagnosis not present

## 2015-09-21 DIAGNOSIS — E669 Obesity, unspecified: Secondary | ICD-10-CM | POA: Diagnosis not present

## 2015-09-21 DIAGNOSIS — E1122 Type 2 diabetes mellitus with diabetic chronic kidney disease: Secondary | ICD-10-CM

## 2015-09-21 DIAGNOSIS — Z6837 Body mass index (BMI) 37.0-37.9, adult: Secondary | ICD-10-CM

## 2015-09-21 DIAGNOSIS — C3412 Malignant neoplasm of upper lobe, left bronchus or lung: Secondary | ICD-10-CM

## 2015-09-21 DIAGNOSIS — C3490 Malignant neoplasm of unspecified part of unspecified bronchus or lung: Secondary | ICD-10-CM | POA: Diagnosis not present

## 2015-09-21 DIAGNOSIS — Z79899 Other long term (current) drug therapy: Secondary | ICD-10-CM

## 2015-09-21 DIAGNOSIS — N189 Chronic kidney disease, unspecified: Secondary | ICD-10-CM

## 2015-09-21 LAB — CBC WITH DIFFERENTIAL/PLATELET
BASOS ABS: 0.1 10*3/uL (ref 0–0.1)
BASOS PCT: 1 %
EOS ABS: 0.2 10*3/uL (ref 0–0.7)
Eosinophils Relative: 2 %
HCT: 48 % — ABNORMAL HIGH (ref 35.0–47.0)
HEMOGLOBIN: 16.2 g/dL — AB (ref 12.0–16.0)
LYMPHS ABS: 1.8 10*3/uL (ref 1.0–3.6)
Lymphocytes Relative: 23 %
MCH: 30.9 pg (ref 26.0–34.0)
MCHC: 33.7 g/dL (ref 32.0–36.0)
MCV: 91.8 fL (ref 80.0–100.0)
Monocytes Absolute: 0.6 10*3/uL (ref 0.2–0.9)
Monocytes Relative: 8 %
NEUTROS PCT: 66 %
Neutro Abs: 5.3 10*3/uL (ref 1.4–6.5)
Platelets: 175 10*3/uL (ref 150–440)
RBC: 5.23 MIL/uL — AB (ref 3.80–5.20)
RDW: 16 % — ABNORMAL HIGH (ref 11.5–14.5)
WBC: 8 10*3/uL (ref 3.6–11.0)

## 2015-09-21 LAB — COMPREHENSIVE METABOLIC PANEL
ALT: 17 U/L (ref 14–54)
ANION GAP: 6 (ref 5–15)
AST: 17 U/L (ref 15–41)
Albumin: 3.7 g/dL (ref 3.5–5.0)
Alkaline Phosphatase: 74 U/L (ref 38–126)
BUN: 21 mg/dL — ABNORMAL HIGH (ref 6–20)
CHLORIDE: 95 mmol/L — AB (ref 101–111)
CO2: 34 mmol/L — ABNORMAL HIGH (ref 22–32)
Calcium: 9.3 mg/dL (ref 8.9–10.3)
Creatinine, Ser: 1.35 mg/dL — ABNORMAL HIGH (ref 0.44–1.00)
GFR, EST AFRICAN AMERICAN: 49 mL/min — AB (ref >60)
GFR, EST NON AFRICAN AMERICAN: 42 mL/min — AB (ref >60)
Glucose, Bld: 135 mg/dL — ABNORMAL HIGH (ref 65–99)
POTASSIUM: 3.8 mmol/L (ref 3.5–5.1)
Sodium: 135 mmol/L (ref 135–145)
Total Bilirubin: 0.7 mg/dL (ref 0.3–1.2)
Total Protein: 7.5 g/dL (ref 6.5–8.1)

## 2015-09-21 MED ORDER — SODIUM CHLORIDE 0.9 % IV SOLN
300.0000 mg | Freq: Once | INTRAVENOUS | Status: AC
  Administered 2015-09-21: 300 mg via INTRAVENOUS
  Filled 2015-09-21: qty 30

## 2015-09-21 MED ORDER — SODIUM CHLORIDE 0.9 % IJ SOLN
10.0000 mL | INTRAMUSCULAR | Status: DC | PRN
  Administered 2015-09-21: 10 mL
  Filled 2015-09-21: qty 10

## 2015-09-21 MED ORDER — HEPARIN SOD (PORK) LOCK FLUSH 100 UNIT/ML IV SOLN
500.0000 [IU] | Freq: Once | INTRAVENOUS | Status: AC | PRN
  Administered 2015-09-21: 500 [IU]
  Filled 2015-09-21: qty 5

## 2015-09-21 MED ORDER — SODIUM CHLORIDE 0.9 % IV SOLN
Freq: Once | INTRAVENOUS | Status: AC
  Administered 2015-09-21: 11:00:00 via INTRAVENOUS
  Filled 2015-09-21: qty 1000

## 2015-09-21 NOTE — Progress Notes (Signed)
[place] @ [facility] [phone]  Fax:([phone]) [place]: [birth year]  MR#: [id number]  YWV#:[id number]  Patient Care Team: [name], MD as PCP - General (Internal Medicine)  CHIEF COMPLAINT:  Chief Complaint  Patient presents with  . OTHER     No history exists.    Oncology Flowsheet 06/04/2015 06/18/2015 07/07/2015 07/27/2015 08/10/2015 08/24/2015 09/07/2015  nivolumab (OPDIVO) IV 300 mg 300 mg 300 mg 300 mg 300 mg 300 mg 300 mg    INTERVAL HISTORY 59 year old lady with stage IV carcinoma of lung patient is on nivolulamab.  Tolerating treatment very well.  Last PET scan shows significant improvement.  Patient has underlying COPD oxygen dependent diabetes and chronic kidney disease. noChills.  No fever. Patient is here to initiate chemotherapy.  No chills.  No fever.   REVIEW OF SYSTEMS:   GENERAL:  Feels good.  Active.  No fevers, sweats or weight loss. PERFORMANCE STATUS (ECOG):1 HEENT:  No visual changes, runny nose, sore throat, mouth sores or tenderness. Lungs: No shortness of breath or cough.  No hemoptysis. Cardiac:  No chest pain, palpitations, orthopnea, or PND. GI:  No nausea, vomiting, diarrhea, constipation, melena or hematochezia. GU:  No urgency, frequency, dysuria, or hematuria. Musculoskeletal:  No back pain.  No joint pain.  No muscle tenderness. Extremities:  No pain or swelling. Skin:  No rashes or skin changes. Neuro:  No headache, numbness or weakness, balance or coordination issues. Endocrine:  No diabetes, thyroid issues, hot flashes or night sweats. Psych:  No mood changes, depression or anxiety. Pain:  Right lower quadrant pain.  Right upper chest wall pain.  Does not require any pain medication takes  Occasional  Tylenol Review of systems:  All other systems reviewed and found to be negative.  As per HPI. Otherwise, a complete review of systems is negatve.  PAST MEDICAL HISTORY: Past Medical History  Diagnosis  Date  . Cancer of lung 05/07/2015   Diabetes. COPD on oxygen PAST SURGICAL HISTORY: No past surgical history on file.  FAMILY HISTORY No family history of lung cancer for colon cancer, ovarian cancer     ADVANCED DIRECTIVES: Patient does not have any advanced healthcare directive. Information has been given.  HEALTH MAINTENANCE: Social History  Substance Use Topics  . Smoking status: Current Every Day Smoker  . Smokeless tobacco: Not on file  . Alcohol Use: Not on file      Allergies  Allergen Reactions  . Codeine Nausea And Vomiting    Current Outpatient Prescriptions  Medication Sig Dispense Refill  . albuterol (PROVENTIL HFA;VENTOLIN HFA) 108 (90 BASE) MCG/ACT inhaler Inhale into the lungs.    . ALPRAZolam (XANAX) 0.25 MG tablet Take 1 tablet (0.25 mg total) by mouth 2 (two) times daily as needed for anxiety. 60 tablet 1  . citalopram (CELEXA) 20 MG tablet Take by mouth.    . fluticasone-salmeterol (ADVAIR HFA) 115-21 MCG/ACT inhaler Inhale into the lungs.    . gabapentin (NEURONTIN) 300 MG capsule Take by mouth.    . hydrochlorothiazide (HYDRODIURIL) 25 MG tablet Take by mouth.    Marland Kitchen HYDROcodone-acetaminophen (NORCO/VICODIN) 5-325 MG per tablet Take 1 tablet by mouth every 6 (six) hours as needed for moderate pain. 30 tablet 0  . insulin lispro (HUMALOG) 100 UNIT/ML injection Inject into the skin 3 (three) times daily before meals.    . Insulin NPH, Human,, Isophane, (HUMULIN N) 100 UNIT/ML Kiwkpen Inject 20 units subcutaneously every morning and 36 units  every night.    . quinapril (ACCUPRIL) 40 MG tablet Take by mouth.     No current facility-administered medications for this visit.   Facility-Administered Medications Ordered in Other Visits  Medication Dose Route Frequency Provider Last Rate Last Dose  . sodium chloride 0.9 % injection 10 mL  10 mL Intracatheter PRN Forest Gleason, MD        OBJECTIVE:  Filed Vitals:   09/21/15 0920  BP: 107/68  Pulse: 111    Temp: 96.8 F (36 C)     Body mass index is 36.83 kg/(m^2).    ECOG FS:1 - Symptomatic but completely ambulatory  PHYSICAL EXAM Gen. status: Patient is alert oriented not in acute distress on oxygen Lungs: COPD.  Emphysematous chest.  Oxygen dependent. Cardiac  Tachycardia Abdomen: Soft.  No ascites.  Liver spleen not palpable Neurological system: Higher functions within normal limit.  No focal sign Lower extremity no edema Skin: No rash Lymphatic system: Palpable left supraclavicular node decreased in size  LAB RESULTS:  Appointment on 09/21/2015  Component Date Value Ref Range Status  . WBC 09/21/2015 8.0  3.6 - 11.0 K/uL Final  . RBC 09/21/2015 5.23* 3.80 - 5.20 MIL/uL Final  . Hemoglobin 09/21/2015 16.2* 12.0 - 16.0 g/dL Final  . HCT 09/21/2015 48.0* 35.0 - 47.0 % Final  . MCV 09/21/2015 91.8  80.0 - 100.0 fL Final  . MCH 09/21/2015 30.9  26.0 - 34.0 pg Final  . MCHC 09/21/2015 33.7  32.0 - 36.0 g/dL Final  . RDW 09/21/2015 16.0* 11.5 - 14.5 % Final  . Platelets 09/21/2015 175  150 - 440 K/uL Final  . Neutrophils Relative % 09/21/2015 66   Final  . Neutro Abs 09/21/2015 5.3  1.4 - 6.5 K/uL Final  . Lymphocytes Relative 09/21/2015 23   Final  . Lymphs Abs 09/21/2015 1.8  1.0 - 3.6 K/uL Final  . Monocytes Relative 09/21/2015 8   Final  . Monocytes Absolute 09/21/2015 0.6  0.2 - 0.9 K/uL Final  . Eosinophils Relative 09/21/2015 2   Final  . Eosinophils Absolute 09/21/2015 0.2  0 - 0.7 K/uL Final  . Basophils Relative 09/21/2015 1   Final  . Basophils Absolute 09/21/2015 0.1  0 - 0.1 K/uL Final  . Sodium 09/21/2015 135  135 - 145 mmol/L Final  . Potassium 09/21/2015 3.8  3.5 - 5.1 mmol/L Final  . Chloride 09/21/2015 95* 101 - 111 mmol/L Final  . CO2 09/21/2015 34* 22 - 32 mmol/L Final  . Glucose, Bld 09/21/2015 135* 65 - 99 mg/dL Final  . BUN 09/21/2015 21* 6 - 20 mg/dL Final  . Creatinine, Ser 09/21/2015 1.35* 0.44 - 1.00 mg/dL Final  . Calcium 09/21/2015 9.3  8.9 -  10.3 mg/dL Final  . Total Protein 09/21/2015 7.5  6.5 - 8.1 g/dL Final  . Albumin 09/21/2015 3.7  3.5 - 5.0 g/dL Final  . AST 09/21/2015 17  15 - 41 U/L Final  . ALT 09/21/2015 17  14 - 54 U/L Final  . Alkaline Phosphatase 09/21/2015 74  38 - 126 U/L Final  . Total Bilirubin 09/21/2015 0.7  0.3 - 1.2 mg/dL Final  . GFR calc non Af Amer 09/21/2015 42* >60 mL/min Final  . GFR calc Af Amer 09/21/2015 49* >60 mL/min Final   Comment: (NOTE) The eGFR has been calculated using the CKD EPI equation. This calculation has not been validated in all clinical situations. eGFR's persistently <60 mL/min signify possible Chronic Kidney Disease.   Marland Kitchen  Anion gap 09/21/2015 6  5 - 15 Final     ASSESSMENT: Stage IV carcinoma of lung Obesity BMI is 37 Chronic smoker Diabetes On oxygen  MEDICAL DECISION MAKING:  Right upper quadrant pain has improved continue Nivolulamab All lab data has been reviewed Patient continues to smoke After next month treatment patient would have a PET scan for reassessment We discussed possibility of reduction in weight to achieve a better control for diabetes.  She has gained significant amount of weight in last few months And stopping smoking so that patient can get off oxygen and can have better quality of life   No matching staging information was found for the patient.  Forest Gleason, MD   09/21/2015 9:42 AM

## 2015-09-21 NOTE — Progress Notes (Signed)
Patient does not have living will.  Currently smokes.

## 2015-10-05 ENCOUNTER — Inpatient Hospital Stay: Payer: Medicare Other | Attending: Oncology

## 2015-10-05 VITALS — BP 113/70 | HR 86 | Temp 96.0°F | Resp 20

## 2015-10-05 DIAGNOSIS — Z794 Long term (current) use of insulin: Secondary | ICD-10-CM | POA: Diagnosis not present

## 2015-10-05 DIAGNOSIS — N189 Chronic kidney disease, unspecified: Secondary | ICD-10-CM | POA: Diagnosis not present

## 2015-10-05 DIAGNOSIS — Z5111 Encounter for antineoplastic chemotherapy: Secondary | ICD-10-CM | POA: Diagnosis present

## 2015-10-05 DIAGNOSIS — Z23 Encounter for immunization: Secondary | ICD-10-CM | POA: Diagnosis not present

## 2015-10-05 DIAGNOSIS — E669 Obesity, unspecified: Secondary | ICD-10-CM | POA: Insufficient documentation

## 2015-10-05 DIAGNOSIS — Z79899 Other long term (current) drug therapy: Secondary | ICD-10-CM | POA: Diagnosis not present

## 2015-10-05 DIAGNOSIS — E119 Type 2 diabetes mellitus without complications: Secondary | ICD-10-CM | POA: Diagnosis not present

## 2015-10-05 DIAGNOSIS — F17210 Nicotine dependence, cigarettes, uncomplicated: Secondary | ICD-10-CM | POA: Insufficient documentation

## 2015-10-05 DIAGNOSIS — R1011 Right upper quadrant pain: Secondary | ICD-10-CM | POA: Insufficient documentation

## 2015-10-05 DIAGNOSIS — C3490 Malignant neoplasm of unspecified part of unspecified bronchus or lung: Secondary | ICD-10-CM | POA: Insufficient documentation

## 2015-10-05 DIAGNOSIS — Z6837 Body mass index (BMI) 37.0-37.9, adult: Secondary | ICD-10-CM | POA: Diagnosis not present

## 2015-10-05 DIAGNOSIS — Z9981 Dependence on supplemental oxygen: Secondary | ICD-10-CM | POA: Diagnosis not present

## 2015-10-05 DIAGNOSIS — J449 Chronic obstructive pulmonary disease, unspecified: Secondary | ICD-10-CM | POA: Diagnosis not present

## 2015-10-05 DIAGNOSIS — C3412 Malignant neoplasm of upper lobe, left bronchus or lung: Secondary | ICD-10-CM

## 2015-10-05 MED ORDER — SODIUM CHLORIDE 0.9 % IJ SOLN
10.0000 mL | INTRAMUSCULAR | Status: DC | PRN
  Filled 2015-10-05: qty 10

## 2015-10-05 MED ORDER — HEPARIN SOD (PORK) LOCK FLUSH 100 UNIT/ML IV SOLN
500.0000 [IU] | Freq: Once | INTRAVENOUS | Status: AC | PRN
  Administered 2015-10-05: 500 [IU]
  Filled 2015-10-05: qty 5

## 2015-10-05 MED ORDER — SODIUM CHLORIDE 0.9 % IV SOLN
300.0000 mg | Freq: Once | INTRAVENOUS | Status: AC
  Administered 2015-10-05: 300 mg via INTRAVENOUS
  Filled 2015-10-05: qty 30

## 2015-10-05 MED ORDER — SODIUM CHLORIDE 0.9 % IV SOLN
Freq: Once | INTRAVENOUS | Status: AC
  Administered 2015-10-05: 09:00:00 via INTRAVENOUS
  Filled 2015-10-05: qty 1000

## 2015-10-19 ENCOUNTER — Encounter: Payer: Self-pay | Admitting: Oncology

## 2015-10-19 ENCOUNTER — Inpatient Hospital Stay: Payer: Medicare Other

## 2015-10-19 ENCOUNTER — Inpatient Hospital Stay (HOSPITAL_BASED_OUTPATIENT_CLINIC_OR_DEPARTMENT_OTHER): Payer: Medicare Other | Admitting: Oncology

## 2015-10-19 VITALS — BP 102/63 | HR 111 | Temp 95.1°F | Wt 216.7 lb

## 2015-10-19 DIAGNOSIS — C3490 Malignant neoplasm of unspecified part of unspecified bronchus or lung: Secondary | ICD-10-CM

## 2015-10-19 DIAGNOSIS — F17210 Nicotine dependence, cigarettes, uncomplicated: Secondary | ICD-10-CM

## 2015-10-19 DIAGNOSIS — C3412 Malignant neoplasm of upper lobe, left bronchus or lung: Secondary | ICD-10-CM

## 2015-10-19 DIAGNOSIS — Z79899 Other long term (current) drug therapy: Secondary | ICD-10-CM

## 2015-10-19 DIAGNOSIS — Z6837 Body mass index (BMI) 37.0-37.9, adult: Secondary | ICD-10-CM

## 2015-10-19 DIAGNOSIS — E669 Obesity, unspecified: Secondary | ICD-10-CM | POA: Diagnosis not present

## 2015-10-19 DIAGNOSIS — R1011 Right upper quadrant pain: Secondary | ICD-10-CM

## 2015-10-19 DIAGNOSIS — Z5111 Encounter for antineoplastic chemotherapy: Secondary | ICD-10-CM | POA: Diagnosis not present

## 2015-10-19 LAB — COMPREHENSIVE METABOLIC PANEL
ALBUMIN: 3.6 g/dL (ref 3.5–5.0)
ALK PHOS: 80 U/L (ref 38–126)
ALT: 15 U/L (ref 14–54)
AST: 19 U/L (ref 15–41)
Anion gap: 7 (ref 5–15)
BUN: 20 mg/dL (ref 6–20)
CHLORIDE: 96 mmol/L — AB (ref 101–111)
CO2: 33 mmol/L — AB (ref 22–32)
Calcium: 9.4 mg/dL (ref 8.9–10.3)
Creatinine, Ser: 1.37 mg/dL — ABNORMAL HIGH (ref 0.44–1.00)
GFR calc Af Amer: 48 mL/min — ABNORMAL LOW (ref >60)
GFR calc non Af Amer: 41 mL/min — ABNORMAL LOW (ref >60)
GLUCOSE: 208 mg/dL — AB (ref 65–99)
Potassium: 3.3 mmol/L — ABNORMAL LOW (ref 3.5–5.1)
SODIUM: 136 mmol/L (ref 135–145)
TOTAL PROTEIN: 7.2 g/dL (ref 6.5–8.1)
Total Bilirubin: 0.6 mg/dL (ref 0.3–1.2)

## 2015-10-19 LAB — CBC WITH DIFFERENTIAL/PLATELET
BASOS ABS: 0.1 10*3/uL (ref 0–0.1)
BASOS PCT: 1 %
EOS ABS: 0.1 10*3/uL (ref 0–0.7)
Eosinophils Relative: 2 %
HCT: 46.2 % (ref 35.0–47.0)
HEMOGLOBIN: 15.5 g/dL (ref 12.0–16.0)
LYMPHS ABS: 1.7 10*3/uL (ref 1.0–3.6)
Lymphocytes Relative: 21 %
MCH: 30.9 pg (ref 26.0–34.0)
MCHC: 33.7 g/dL (ref 32.0–36.0)
MCV: 91.7 fL (ref 80.0–100.0)
Monocytes Absolute: 0.6 10*3/uL (ref 0.2–0.9)
Monocytes Relative: 8 %
NEUTROS PCT: 68 %
Neutro Abs: 5.4 10*3/uL (ref 1.4–6.5)
PLATELETS: 183 10*3/uL (ref 150–440)
RBC: 5.03 MIL/uL (ref 3.80–5.20)
RDW: 15.9 % — ABNORMAL HIGH (ref 11.5–14.5)
WBC: 7.9 10*3/uL (ref 3.6–11.0)

## 2015-10-19 LAB — MAGNESIUM: Magnesium: 1.8 mg/dL (ref 1.7–2.4)

## 2015-10-19 MED ORDER — SODIUM CHLORIDE 0.9 % IJ SOLN
10.0000 mL | INTRAMUSCULAR | Status: DC | PRN
  Administered 2015-10-19: 10 mL
  Filled 2015-10-19: qty 10

## 2015-10-19 MED ORDER — NIVOLUMAB CHEMO INJECTION 100 MG/10ML
240.0000 mg | Freq: Once | INTRAVENOUS | Status: AC
  Administered 2015-10-19: 240 mg via INTRAVENOUS
  Filled 2015-10-19: qty 20

## 2015-10-19 MED ORDER — INFLUENZA VAC SPLIT QUAD 0.5 ML IM SUSY
0.5000 mL | PREFILLED_SYRINGE | Freq: Once | INTRAMUSCULAR | Status: AC
  Administered 2015-10-19: 0.5 mL via INTRAMUSCULAR
  Filled 2015-10-19: qty 0.5

## 2015-10-19 MED ORDER — HEPARIN SOD (PORK) LOCK FLUSH 100 UNIT/ML IV SOLN
500.0000 [IU] | Freq: Once | INTRAVENOUS | Status: AC | PRN
  Administered 2015-10-19: 500 [IU]
  Filled 2015-10-19: qty 5

## 2015-10-19 MED ORDER — POTASSIUM CHLORIDE CRYS ER 20 MEQ PO TBCR: 20.0000 meq | EXTENDED_RELEASE_TABLET | Freq: Every day | ORAL | Status: AC

## 2015-10-19 MED ORDER — SODIUM CHLORIDE 0.9 % IV SOLN
Freq: Once | INTRAVENOUS | Status: AC
  Administered 2015-10-19: 10:00:00 via INTRAVENOUS
  Filled 2015-10-19: qty 1000

## 2015-10-19 MED ORDER — ALBUTEROL SULFATE HFA 108 (90 BASE) MCG/ACT IN AERS: 1.0000 | INHALATION_SPRAY | RESPIRATORY_TRACT | Status: AC | PRN

## 2015-10-19 NOTE — Progress Notes (Signed)
[place] @ [facility] [phone]  Fax:(336) [place]: Sep 29, 1956  MR#: [id number]  LKG#:[id number]  Patient Care Team: [name], MD as PCP - General (Internal Medicine)  CHIEF COMPLAINT:  Chief Complaint  Patient presents with  . OTHER     No history exists.    Oncology Flowsheet 07/07/2015 07/27/2015 08/10/2015 08/24/2015 09/07/2015 09/21/2015 10/05/2015  nivolumab (OPDIVO) IV 300 _0  mg 300 mg    INTERVAL HISTORY 59 year old Kristine Escobar with stage IV carcinoma of lung patient is on nivolulamab.  Tolerating treatment very well.  Last PET scan shows significant improvement.  Patient has underlying COPD oxygen dependent diabetes and chronic kidney disease. noChills.  No fever. Patient is here to initiate chemotherapy.  No chills.  No fever.  Patient smokes smoked heavily in last few days Having extremely agitated because of social issues Did not buy any inhalers   REVIEW OF SYSTEMS:   GENERAL:  Feels good.  Active.  No fevers, sweats or weight loss. PERFORMANCE STATUS (ECOG):1 HEENT:  No visual changes, runny nose, sore throat, mouth sores or tenderness. Lungs: No shortness of breath or cough.  No hemoptysis. Cardiac:  No chest pain, palpitations, orthopnea, or PND. GI:  No nausea, vomiting, diarrhea, constipation, melena or hematochezia. GU:  No urgency, frequency, dysuria, or hematuria. Musculoskeletal:  No back pain.  No joint pain.  No muscle tenderness. Extremities:  No pain or swelling. Skin:  No rashes or skin changes. Neuro:  No headache, numbness or weakness, balance or coordination issues. Endocrine:  No diabetes, thyroid issues, hot flashes or night sweats. Psych:  No mood changes, depression or anxiety. Pain:  Right lower quadrant pain.  Right upper chest wall pain.  Does not require any pain medication takes  Occasional  Tylenol Review of systems:  All other systems reviewed and found to be  negative.  As per HPI. Otherwise, a complete review of systems is negatve.  PAST MEDICAL HISTORY: Past Medical History  Diagnosis Date  . Cancer of lung ([place]) 05/07/2015   Diabetes. COPD on oxygen PAST SURGICAL HISTORY: Has been reviewed from previous notes FAMILY HISTORY No family history of lung cancer for colon cancer, ovarian cancer     ADVANCED DIRECTIVES: Patient does not have any advanced healthcare directive. Information has been given.  HEALTH MAINTENANCE: Social History  Substance Use Topics  . Smoking status: Current Every Day Smoker  . Smokeless tobacco: None  . Alcohol Use: None      Allergies  Allergen Reactions  . Codeine Nausea And Vomiting    Current Outpatient Prescriptions  Medication Sig Dispense Refill  . albuterol (PROVENTIL HFA;VENTOLIN HFA) 108 (90 BASE) MCG/ACT inhaler Inhale into the lungs.    . ALPRAZolam (XANAX) 0.25 MG tablet Take 1 tablet (0.25 mg total) by mouth 2 (two) times daily as needed for anxiety. 60 tablet 1  . citalopram (CELEXA) 20 MG tablet Take by mouth.    . fluticasone-salmeterol (ADVAIR HFA) 115-21 MCG/ACT inhaler Inhale into the lungs.    . gabapentin (NEURONTIN) 300 MG capsule Take by mouth.    . hydrochlorothiazide (HYDRODIURIL) 25 MG tablet Take by mouth.    Marland Kitchen HYDROcodone-acetaminophen (NORCO/VICODIN) 5-325 MG per tablet Take 1 tablet by mouth every 6 (six) hours as needed for moderate pain. 30 tablet 0  . insulin lispro (HUMALOG) 100 UNIT/ML injection Inject into the skin 3 (three) times daily before meals.    . Insulin  NPH, Human,, Isophane, (HUMULIN N) 100 UNIT/ML Kiwkpen Inject 20 units subcutaneously every morning and 36 units every night.    . quinapril (ACCUPRIL) 40 MG tablet Take by mouth.     No current facility-administered medications for this visit.   Facility-Administered Medications Ordered in Other Visits  Medication Dose Route Frequency Provider Last Rate Last Dose  . sodium chloride 0.9 % injection  10 mL  10 mL Intracatheter PRN Forest Gleason, MD        OBJECTIVE:  Filed Vitals:   10/19/15 0856  BP: 102/63  Pulse: 111  Temp: 95.1 F (35.1 C)     Body mass index is 36.55 kg/(m^2).    ECOG FS:1 - Symptomatic but completely ambulatory  PHYSICAL EXAM Gen. status: Patient is alert oriented not in acute distress on oxygen . ntinues to gain weight Lungs: COPD.  Emphysematous chest.  Oxygen dependent. Bilateral rhonchi. Cardiac  Tachycardia Abdomen: Soft.  No ascites.  Liver spleen not palpable Neurological system: Higher functions within normal limit.  No focal sign Lower extremity no edema Skin: No rash Lymphatic system: Palpable left supraclavicular node decreased in size Psychiatric system: Patient is quite emotional and tearful because of social issues LAB RESULTS:  No visits with results within 3 Day(s) from this visit. Latest known visit with results is:  Appointment on 09/21/2015  Component Date Value Ref Range Status  . WBC 09/21/2015 8.0  3.6 - 11.0 K/uL Final  . RBC 09/21/2015 5.23* 3.80 - 5.20 MIL/uL Final  . Hemoglobin 09/21/2015 16.2* 12.0 - 16.0 g/dL Final  . HCT 09/21/2015 48.0* 35.0 - 47.0 % Final  . MCV 09/21/2015 91.8  80.0 - 100.0 fL Final  . MCH 09/21/2015 30.9  26.0 - 34.0 pg Final  . MCHC 09/21/2015 33.7  32.0 - 36.0 g/dL Final  . RDW 09/21/2015 16.0* 11.5 - 14.5 % Final  . Platelets 09/21/2015 175  150 - 440 K/uL Final  . Neutrophils Relative % 09/21/2015 66   Final  . Neutro Abs 09/21/2015 5.3  1.4 - 6.5 K/uL Final  . Lymphocytes Relative 09/21/2015 23   Final  . Lymphs Abs 09/21/2015 1.8  1.0 - 3.6 K/uL Final  . Monocytes Relative 09/21/2015 8   Final  . Monocytes Absolute 09/21/2015 0.6  0.2 - 0.9 K/uL Final  . Eosinophils Relative 09/21/2015 2   Final  . Eosinophils Absolute 09/21/2015 0.2  0 - 0.7 K/uL Final  . Basophils Relative 09/21/2015 1   Final  . Basophils Absolute 09/21/2015 0.1  0 - 0.1 K/uL Final  . Sodium 09/21/2015 135  135 -  145 mmol/L Final  . Potassium 09/21/2015 3.8  3.5 - 5.1 mmol/L Final  . Chloride 09/21/2015 95* 101 - 111 mmol/L Final  . CO2 09/21/2015 34* 22 - 32 mmol/L Final  . Glucose, Bld 09/21/2015 135* 65 - 99 mg/dL Final  . BUN 09/21/2015 21* 6 - 20 mg/dL Final  . Creatinine, Ser 09/21/2015 1.35* 0.44 - 1.00 mg/dL Final  . Calcium 09/21/2015 9.3  8.9 - 10.3 mg/dL Final  . Total Protein 09/21/2015 7.5  6.5 - 8.1 g/dL Final  . Albumin 09/21/2015 3.7  3.5 - 5.0 g/dL Final  . AST 09/21/2015 17  15 - 41 U/L Final  . ALT 09/21/2015 17  14 - 54 U/L Final  . Alkaline Phosphatase 09/21/2015 74  38 - 126 U/L Final  . Total Bilirubin 09/21/2015 0.7  0.3 - 1.2 mg/dL Final  . GFR calc non Af Wyvonnia Lora  09/21/2015 42* >60 mL/min Final  . GFR calc Af Amer 09/21/2015 49* >60 mL/min Final   Comment: (NOTE) The eGFR has been calculated using the CKD EPI equation. This calculation has not been validated in all clinical situations. eGFR's persistently <60 mL/min signify possible Chronic Kidney Disease.   . Anion gap 09/21/2015 6  5 - 15 Final     ASSESSMENT: Stage IV carcinoma of lung Obesity BMI is 37 Chronic smoker Diabetes On oxygen  MEDICAL DECISION MAKING:  Right upper quadrant pain has improved continue Nivolulamab All lab data has been reviewed Patient continues to smoke After next month treatment patient would have a PET scan for reassessment We discussed possibility of reduction in weight to achieve a better control for diabetes.  She has gained significant amount of weight in last few months And stopping smoking so that patient can get off oxygen and can have better quality of life   No matching staging information was found for the patient.  Forest Gleason, MD   10/19/2015 9:10 AM

## 2015-10-19 NOTE — Progress Notes (Signed)
Patient states she needs something for her nerves.

## 2015-11-02 ENCOUNTER — Inpatient Hospital Stay: Payer: Medicare Other | Attending: Oncology

## 2015-11-02 VITALS — BP 110/65 | HR 86 | Temp 96.1°F | Resp 20

## 2015-11-02 DIAGNOSIS — C3412 Malignant neoplasm of upper lobe, left bronchus or lung: Secondary | ICD-10-CM

## 2015-11-02 DIAGNOSIS — E669 Obesity, unspecified: Secondary | ICD-10-CM | POA: Insufficient documentation

## 2015-11-02 DIAGNOSIS — Z794 Long term (current) use of insulin: Secondary | ICD-10-CM | POA: Insufficient documentation

## 2015-11-02 DIAGNOSIS — Z9981 Dependence on supplemental oxygen: Secondary | ICD-10-CM | POA: Insufficient documentation

## 2015-11-02 DIAGNOSIS — M25551 Pain in right hip: Secondary | ICD-10-CM | POA: Diagnosis not present

## 2015-11-02 DIAGNOSIS — C3490 Malignant neoplasm of unspecified part of unspecified bronchus or lung: Secondary | ICD-10-CM | POA: Insufficient documentation

## 2015-11-02 DIAGNOSIS — Z79899 Other long term (current) drug therapy: Secondary | ICD-10-CM | POA: Insufficient documentation

## 2015-11-02 DIAGNOSIS — F17210 Nicotine dependence, cigarettes, uncomplicated: Secondary | ICD-10-CM | POA: Diagnosis not present

## 2015-11-02 DIAGNOSIS — N189 Chronic kidney disease, unspecified: Secondary | ICD-10-CM | POA: Insufficient documentation

## 2015-11-02 DIAGNOSIS — Z6837 Body mass index (BMI) 37.0-37.9, adult: Secondary | ICD-10-CM | POA: Insufficient documentation

## 2015-11-02 DIAGNOSIS — E119 Type 2 diabetes mellitus without complications: Secondary | ICD-10-CM | POA: Diagnosis not present

## 2015-11-02 DIAGNOSIS — J449 Chronic obstructive pulmonary disease, unspecified: Secondary | ICD-10-CM | POA: Insufficient documentation

## 2015-11-02 DIAGNOSIS — M545 Low back pain: Secondary | ICD-10-CM | POA: Insufficient documentation

## 2015-11-02 MED ORDER — SODIUM CHLORIDE 0.9 % IV SOLN
Freq: Once | INTRAVENOUS | Status: AC
Start: 2015-11-02 — End: 2015-11-02
  Administered 2015-11-02: 10:00:00 via INTRAVENOUS
  Filled 2015-11-02: qty 1000

## 2015-11-02 MED ORDER — SODIUM CHLORIDE 0.9 % IJ SOLN
10.0000 mL | INTRAMUSCULAR | Status: DC | PRN
  Administered 2015-11-02: 10 mL via INTRAVENOUS
  Filled 2015-11-02: qty 10

## 2015-11-02 MED ORDER — SODIUM CHLORIDE 0.9 % IV SOLN
240.0000 mg | Freq: Once | INTRAVENOUS | Status: AC
  Administered 2015-11-02: 240 mg via INTRAVENOUS
  Filled 2015-11-02: qty 20

## 2015-11-02 MED ORDER — HEPARIN SOD (PORK) LOCK FLUSH 100 UNIT/ML IV SOLN
500.0000 [IU] | Freq: Once | INTRAVENOUS | Status: DC | PRN
  Filled 2015-11-02: qty 5

## 2015-11-23 ENCOUNTER — Inpatient Hospital Stay: Payer: Medicare Other

## 2015-11-23 ENCOUNTER — Inpatient Hospital Stay (HOSPITAL_BASED_OUTPATIENT_CLINIC_OR_DEPARTMENT_OTHER): Payer: Medicare Other | Admitting: Oncology

## 2015-11-23 ENCOUNTER — Encounter: Payer: Self-pay | Admitting: Oncology

## 2015-11-23 VITALS — BP 126/79 | HR 88 | Temp 96.4°F | Wt 216.7 lb

## 2015-11-23 DIAGNOSIS — M25551 Pain in right hip: Secondary | ICD-10-CM

## 2015-11-23 DIAGNOSIS — Z79899 Other long term (current) drug therapy: Secondary | ICD-10-CM

## 2015-11-23 DIAGNOSIS — C3412 Malignant neoplasm of upper lobe, left bronchus or lung: Secondary | ICD-10-CM

## 2015-11-23 DIAGNOSIS — F17210 Nicotine dependence, cigarettes, uncomplicated: Secondary | ICD-10-CM

## 2015-11-23 DIAGNOSIS — M545 Low back pain: Secondary | ICD-10-CM

## 2015-11-23 DIAGNOSIS — C3490 Malignant neoplasm of unspecified part of unspecified bronchus or lung: Secondary | ICD-10-CM

## 2015-11-23 DIAGNOSIS — Z1231 Encounter for screening mammogram for malignant neoplasm of breast: Secondary | ICD-10-CM

## 2015-11-23 DIAGNOSIS — E669 Obesity, unspecified: Secondary | ICD-10-CM

## 2015-11-23 DIAGNOSIS — Z6837 Body mass index (BMI) 37.0-37.9, adult: Secondary | ICD-10-CM

## 2015-11-23 LAB — COMPREHENSIVE METABOLIC PANEL
ALBUMIN: 3.7 g/dL (ref 3.5–5.0)
ALT: 13 U/L — ABNORMAL LOW (ref 14–54)
ANION GAP: 7 (ref 5–15)
AST: 13 U/L — AB (ref 15–41)
Alkaline Phosphatase: 84 U/L (ref 38–126)
BUN: 26 mg/dL — AB (ref 6–20)
CO2: 33 mmol/L — AB (ref 22–32)
Calcium: 9.4 mg/dL (ref 8.9–10.3)
Chloride: 94 mmol/L — ABNORMAL LOW (ref 101–111)
Creatinine, Ser: 1.42 mg/dL — ABNORMAL HIGH (ref 0.44–1.00)
GFR calc Af Amer: 45 mL/min — ABNORMAL LOW (ref >60)
GFR calc non Af Amer: 39 mL/min — ABNORMAL LOW (ref >60)
GLUCOSE: 164 mg/dL — AB (ref 65–99)
POTASSIUM: 3.8 mmol/L (ref 3.5–5.1)
SODIUM: 134 mmol/L — AB (ref 135–145)
Total Bilirubin: 0.5 mg/dL (ref 0.3–1.2)
Total Protein: 7.4 g/dL (ref 6.5–8.1)

## 2015-11-23 LAB — CBC WITH DIFFERENTIAL/PLATELET
BASOS ABS: 0.1 10*3/uL (ref 0–0.1)
BASOS PCT: 1 %
EOS ABS: 0.1 10*3/uL (ref 0–0.7)
Eosinophils Relative: 2 %
HEMATOCRIT: 47.5 % — AB (ref 35.0–47.0)
HEMOGLOBIN: 16 g/dL (ref 12.0–16.0)
Lymphocytes Relative: 26 %
Lymphs Abs: 1.8 10*3/uL (ref 1.0–3.6)
MCH: 31 pg (ref 26.0–34.0)
MCHC: 33.8 g/dL (ref 32.0–36.0)
MCV: 91.8 fL (ref 80.0–100.0)
MONOS PCT: 7 %
Monocytes Absolute: 0.5 10*3/uL (ref 0.2–0.9)
NEUTROS ABS: 4.3 10*3/uL (ref 1.4–6.5)
NEUTROS PCT: 64 %
Platelets: 182 10*3/uL (ref 150–440)
RBC: 5.17 MIL/uL (ref 3.80–5.20)
RDW: 16.1 % — AB (ref 11.5–14.5)
WBC: 6.8 10*3/uL (ref 3.6–11.0)

## 2015-11-23 LAB — MAGNESIUM: Magnesium: 2 mg/dL (ref 1.7–2.4)

## 2015-11-23 MED ORDER — SODIUM CHLORIDE 0.9 % IV SOLN
240.0000 mg | Freq: Once | INTRAVENOUS | Status: AC
  Administered 2015-11-23: 240 mg via INTRAVENOUS
  Filled 2015-11-23: qty 8

## 2015-11-23 MED ORDER — HEPARIN SOD (PORK) LOCK FLUSH 100 UNIT/ML IV SOLN
500.0000 [IU] | Freq: Once | INTRAVENOUS | Status: AC | PRN
  Administered 2015-11-23: 500 [IU]
  Filled 2015-11-23: qty 5

## 2015-11-23 MED ORDER — SODIUM CHLORIDE 0.9 % IJ SOLN
10.0000 mL | INTRAMUSCULAR | Status: DC | PRN
  Administered 2015-11-23: 10 mL
  Filled 2015-11-23: qty 10

## 2015-11-23 MED ORDER — HYDROCODONE-ACETAMINOPHEN 5-325 MG PO TABS: 1.0000 | ORAL_TABLET | Freq: Four times a day (QID) | ORAL | Status: DC | PRN

## 2015-11-23 MED ORDER — SODIUM CHLORIDE 0.9 % IV SOLN
Freq: Once | INTRAVENOUS | Status: AC
  Administered 2015-11-23: 10:00:00 via INTRAVENOUS
  Filled 2015-11-23: qty 1000

## 2015-11-23 NOTE — Progress Notes (Signed)
Kristine Escobar @ [facility] [phone]  Fax:([phone]) [place]: January 05, 1956  MR#: [id number]  IHK#:[id number]  Patient Care Team: [name], MD as PCP - General (Internal Medicine)  CHIEF COMPLAINT:  Chief Complaint  Patient presents with  . Lung Cancer     No history exists.    Oncology Flowsheet 08/10/2015 08/24/2015 09/07/2015 09/21/2015 10/05/2015 10/19/2015 11/02/2015  nivolumab (OPDIVO) IV 300 mg 300 mg 300 mg 300 mg 300 mg 240 mg 240 mg    INTERVAL HISTORY 59 year old lady with stage IV carcinoma of lung patient is on nivolulamab.  Tolerating treatment very well.  Last PET scan shows significant improvement.  Patient has underlying COPD oxygen dependent diabetes and chronic kidney disease. noChills.  No fever. Patient is here to initiate chemotherapy.  No chills.  No fever.  Patient smokes smoked heavily in last few days Having extremely agitated because of social issues Did not buy any inhalers The patient is complaining of low back pain and right hip pain.  Over-the-counter medication is not helping with the pain. Continues to smoke.  REVIEW OF SYSTEMS:   GENERAL:  Feels good.  Active.  No fevers, sweats or weight loss. PERFORMANCE STATUS (ECOG):1 HEENT:  No visual changes, runny nose, sore throat, mouth sores or tenderness. Lungs: No shortness of breath or cough.  No hemoptysis. Cardiac:  No chest pain, palpitations, orthopnea, or PND. GI:  No nausea, vomiting, diarrhea, constipation, melena or hematochezia. GU:  No urgency, frequency, dysuria, or hematuria. Musculoskeletal:  Low back pain and pain radiating down to right hip Extremities:  No pain or swelling. Skin:  No rashes or skin changes. Neuro:  No headache, numbness or weakness, balance or coordination issues. Endocrine:  No diabetes, thyroid issues, hot flashes or night sweats. Psych:  No mood changes, depression or anxiety. Pain:  Right lower quadrant pain.  Right upper chest  wall pain.  Does not require any pain medication takes  Occasional  Tylenol Review of systems:  All other systems reviewed and found to be negative.  As per HPI. Otherwise, a complete review of systems is negatve.  PAST MEDICAL HISTORY: Past Medical History  Diagnosis Date  . Cancer of lung ([place]) 05/07/2015   Diabetes. COPD on oxygen PAST SURGICAL HISTORY: Has been reviewed from previous notes FAMILY HISTORY No family history of lung cancer for colon cancer, ovarian cancer     ADVANCED DIRECTIVES: Patient does not have any advanced healthcare directive. Information has been given.  HEALTH MAINTENANCE: Social History  Substance Use Topics  . Smoking status: Current Every Day Smoker  . Smokeless tobacco: Not on file  . Alcohol Use: Not on file      Allergies  Allergen Reactions  . Codeine Nausea And Vomiting    Current Outpatient Prescriptions  Medication Sig Dispense Refill  . albuterol (PROVENTIL HFA;VENTOLIN HFA) 108 (90 BASE) MCG/ACT inhaler Inhale 1-2 puffs into the lungs every 4 (four) hours as needed for wheezing or shortness of breath. 1 Inhaler 3  . ALPRAZolam (XANAX) 0.25 MG tablet Take 1 tablet (0.25 mg total) by mouth 2 (two) times daily as needed for anxiety. 60 tablet 1  . citalopram (CELEXA) 20 MG tablet Take by mouth.    . fluticasone-salmeterol (ADVAIR HFA) 115-21 MCG/ACT inhaler Inhale into the lungs.    . gabapentin (NEURONTIN) 300 MG capsule Take by mouth.    . hydrochlorothiazide (HYDRODIURIL) 25 MG tablet Take by mouth.    Marland Kitchen HYDROcodone-acetaminophen (NORCO/VICODIN) 5-325 MG  per tablet Take 1 tablet by mouth every 6 (six) hours as needed for moderate pain. 30 tablet 0  . insulin lispro (HUMALOG) 100 UNIT/ML injection Inject into the skin 3 (three) times daily before meals.    . Insulin NPH, Human,, Isophane, (HUMULIN N) 100 UNIT/ML Kiwkpen Inject 20 units subcutaneously every morning and 36 units every night.    . potassium chloride SA  (K-DUR,KLOR-CON) 20 MEQ tablet Take 1 tablet (20 mEq total) by mouth daily. 30 tablet 0  . quinapril (ACCUPRIL) 40 MG tablet Take by mouth.     No current facility-administered medications for this visit.   Facility-Administered Medications Ordered in Other Visits  Medication Dose Route Frequency Provider Last Rate Last Dose  . sodium chloride 0.9 % injection 10 mL  10 mL Intracatheter PRN Forest Gleason, MD        OBJECTIVE:  Filed Vitals:   11/23/15 0914  BP: 126/79  Pulse: 88  Temp: 96.4 F (35.8 C)     Body mass index is 36.55 kg/(m^2).    ECOG FS:1 - Symptomatic but completely ambulatory  PHYSICAL EXAM Gen. status: Patient is alert oriented not in acute distress on oxygen . Moderately obese lady on oxygen Lungs: COPD.  Emphysematous chest.  Oxygen dependent. Bilateral rhonchi. Cardiac  Tachycardia Abdomen: Soft.  No ascites.  Liver spleen not palpable Neurological system: Higher functions within normal limit.  No focal sign Lower extremity no edema Skin: No rash Lymphatic system: Palpable left supraclavicular node decreased in size Psychiatric system: Patient is less anxious LAB RESULTS:  Appointment on 11/23/2015  Component Date Value Ref Range Status  . WBC 11/23/2015 6.8  3.6 - 11.0 K/uL Final  . RBC 11/23/2015 5.17  3.80 - 5.20 MIL/uL Final  . Hemoglobin 11/23/2015 16.0  12.0 - 16.0 g/dL Final  . HCT 11/23/2015 47.5* 35.0 - 47.0 % Final  . MCV 11/23/2015 91.8  80.0 - 100.0 fL Final  . MCH 11/23/2015 31.0  26.0 - 34.0 pg Final  . MCHC 11/23/2015 33.8  32.0 - 36.0 g/dL Final  . RDW 11/23/2015 16.1* 11.5 - 14.5 % Final  . Platelets 11/23/2015 182  150 - 440 K/uL Final  . Neutrophils Relative % 11/23/2015 64   Final  . Neutro Abs 11/23/2015 4.3  1.4 - 6.5 K/uL Final  . Lymphocytes Relative 11/23/2015 26   Final  . Lymphs Abs 11/23/2015 1.8  1.0 - 3.6 K/uL Final  . Monocytes Relative 11/23/2015 7   Final  . Monocytes Absolute 11/23/2015 0.5  0.2 - 0.9 K/uL Final    . Eosinophils Relative 11/23/2015 2   Final  . Eosinophils Absolute 11/23/2015 0.1  0 - 0.7 K/uL Final  . Basophils Relative 11/23/2015 1   Final  . Basophils Absolute 11/23/2015 0.1  0 - 0.1 K/uL Final     ASSESSMENT: Stage IV carcinoma of lung Obesity BMI is 37 Chronic smoker Diabetes On oxygen Low back pain pain radiating down to lower extremity MEDICAL DECISION MAKING:  Low back pain is most likely due to degenerative arthritis and weight gain however possibility of metastases to the hip or spine cannot be ruled out.  I recommended PET scan.  Patient desires to get that done after Christmas. Vicodin has been recommended for pain control Patient was advised to control blood sugar prior to that scanning Consult regarding smoking Continue chemotherapy   No matching staging information was found for the patient.  Forest Gleason, MD   11/23/2015 9:25 AM

## 2015-11-23 NOTE — Progress Notes (Signed)
Patient states she is having right sided back pain that radiates into her right hip and leg.

## 2015-11-24 ENCOUNTER — Telehealth: Payer: Self-pay | Admitting: *Deleted

## 2015-11-24 MED ORDER — ALPRAZOLAM 0.25 MG PO TABS: 0.2500 mg | ORAL_TABLET | Freq: Two times a day (BID) | ORAL | Status: DC | PRN

## 2015-11-24 NOTE — Telephone Encounter (Signed)
Escribed

## 2015-12-07 ENCOUNTER — Inpatient Hospital Stay: Payer: Medicare Other | Attending: Oncology

## 2015-12-07 VITALS — BP 110/66 | HR 80 | Temp 97.0°F | Resp 20

## 2015-12-07 DIAGNOSIS — C3490 Malignant neoplasm of unspecified part of unspecified bronchus or lung: Secondary | ICD-10-CM | POA: Diagnosis present

## 2015-12-07 DIAGNOSIS — C3412 Malignant neoplasm of upper lobe, left bronchus or lung: Secondary | ICD-10-CM

## 2015-12-07 DIAGNOSIS — F17210 Nicotine dependence, cigarettes, uncomplicated: Secondary | ICD-10-CM | POA: Diagnosis not present

## 2015-12-07 DIAGNOSIS — Z79899 Other long term (current) drug therapy: Secondary | ICD-10-CM | POA: Diagnosis not present

## 2015-12-07 DIAGNOSIS — M545 Low back pain: Secondary | ICD-10-CM | POA: Insufficient documentation

## 2015-12-07 DIAGNOSIS — M25551 Pain in right hip: Secondary | ICD-10-CM | POA: Insufficient documentation

## 2015-12-07 DIAGNOSIS — Z5111 Encounter for antineoplastic chemotherapy: Secondary | ICD-10-CM | POA: Insufficient documentation

## 2015-12-07 MED ORDER — SODIUM CHLORIDE 0.9 % IV SOLN
Freq: Once | INTRAVENOUS | Status: AC
  Administered 2015-12-07: 10:00:00 via INTRAVENOUS
  Filled 2015-12-07: qty 1000

## 2015-12-07 MED ORDER — SODIUM CHLORIDE 0.9 % IV SOLN
240.0000 mg | Freq: Once | INTRAVENOUS | Status: AC
  Administered 2015-12-07: 240 mg via INTRAVENOUS
  Filled 2015-12-07: qty 8

## 2015-12-07 MED ORDER — HEPARIN SOD (PORK) LOCK FLUSH 100 UNIT/ML IV SOLN
500.0000 [IU] | Freq: Once | INTRAVENOUS | Status: AC | PRN
  Filled 2015-12-07: qty 5

## 2015-12-07 MED ORDER — HEPARIN SOD (PORK) LOCK FLUSH 100 UNIT/ML IV SOLN
500.0000 [IU] | Freq: Once | INTRAVENOUS | Status: AC
  Administered 2015-12-07: 500 [IU] via INTRAVENOUS

## 2015-12-07 MED ORDER — SODIUM CHLORIDE 0.9 % IJ SOLN
10.0000 mL | INTRAMUSCULAR | Status: DC | PRN
  Administered 2015-12-07: 10 mL
  Filled 2015-12-07: qty 10

## 2015-12-29 ENCOUNTER — Telehealth: Payer: Self-pay | Admitting: *Deleted

## 2015-12-29 MED ORDER — ALPRAZOLAM 0.25 MG PO TABS: 0.2500 mg | ORAL_TABLET | Freq: Two times a day (BID) | ORAL | Status: DC | PRN

## 2015-12-29 NOTE — Telephone Encounter (Signed)
Faxed

## 2015-12-30 ENCOUNTER — Ambulatory Visit
Admission: RE | Admit: 2015-12-30 | Discharge: 2015-12-30 | Disposition: A | Discharge status: Home / Self Care | Payer: Medicare Other | Source: Ambulatory Visit | Attending: Oncology | Admitting: Oncology

## 2015-12-30 ENCOUNTER — Ambulatory Visit: Payer: Medicare Other

## 2015-12-30 DIAGNOSIS — R590 Localized enlarged lymph nodes: Secondary | ICD-10-CM | POA: Diagnosis not present

## 2015-12-30 DIAGNOSIS — C3490 Malignant neoplasm of unspecified part of unspecified bronchus or lung: Secondary | ICD-10-CM | POA: Insufficient documentation

## 2015-12-30 DIAGNOSIS — R918 Other nonspecific abnormal finding of lung field: Secondary | ICD-10-CM | POA: Diagnosis not present

## 2015-12-30 DIAGNOSIS — D3502 Benign neoplasm of left adrenal gland: Secondary | ICD-10-CM | POA: Insufficient documentation

## 2015-12-30 DIAGNOSIS — I2510 Atherosclerotic heart disease of native coronary artery without angina pectoris: Secondary | ICD-10-CM | POA: Diagnosis not present

## 2015-12-30 DIAGNOSIS — C7951 Secondary malignant neoplasm of bone: Secondary | ICD-10-CM | POA: Insufficient documentation

## 2015-12-30 LAB — GLUCOSE, CAPILLARY: Glucose-Capillary: 151 mg/dL — ABNORMAL HIGH (ref 65–99)

## 2015-12-30 MED ORDER — FLUDEOXYGLUCOSE F - 18 (FDG) INJECTION
12.8200 | Freq: Once | INTRAVENOUS | Status: AC | PRN
  Administered 2015-12-30: 12.82 via INTRAVENOUS

## 2015-12-31 ENCOUNTER — Inpatient Hospital Stay (HOSPITAL_BASED_OUTPATIENT_CLINIC_OR_DEPARTMENT_OTHER): Payer: Medicare Other | Admitting: Oncology

## 2015-12-31 ENCOUNTER — Inpatient Hospital Stay: Payer: Medicare Other

## 2015-12-31 ENCOUNTER — Inpatient Hospital Stay: Payer: Medicare Other | Attending: Oncology

## 2015-12-31 VITALS — BP 143/79 | HR 111 | Temp 97.0°F | Wt 216.3 lb

## 2015-12-31 DIAGNOSIS — Z1231 Encounter for screening mammogram for malignant neoplasm of breast: Secondary | ICD-10-CM

## 2015-12-31 DIAGNOSIS — M545 Low back pain: Secondary | ICD-10-CM | POA: Insufficient documentation

## 2015-12-31 DIAGNOSIS — E669 Obesity, unspecified: Secondary | ICD-10-CM | POA: Diagnosis not present

## 2015-12-31 DIAGNOSIS — F17210 Nicotine dependence, cigarettes, uncomplicated: Secondary | ICD-10-CM

## 2015-12-31 DIAGNOSIS — Z6837 Body mass index (BMI) 37.0-37.9, adult: Secondary | ICD-10-CM

## 2015-12-31 DIAGNOSIS — D3502 Benign neoplasm of left adrenal gland: Secondary | ICD-10-CM

## 2015-12-31 DIAGNOSIS — M25571 Pain in right ankle and joints of right foot: Secondary | ICD-10-CM | POA: Insufficient documentation

## 2015-12-31 DIAGNOSIS — C7951 Secondary malignant neoplasm of bone: Secondary | ICD-10-CM

## 2015-12-31 DIAGNOSIS — Z79899 Other long term (current) drug therapy: Secondary | ICD-10-CM

## 2015-12-31 DIAGNOSIS — H538 Other visual disturbances: Secondary | ICD-10-CM | POA: Diagnosis not present

## 2015-12-31 DIAGNOSIS — Z9981 Dependence on supplemental oxygen: Secondary | ICD-10-CM | POA: Diagnosis not present

## 2015-12-31 DIAGNOSIS — Z794 Long term (current) use of insulin: Secondary | ICD-10-CM | POA: Insufficient documentation

## 2015-12-31 DIAGNOSIS — C3490 Malignant neoplasm of unspecified part of unspecified bronchus or lung: Secondary | ICD-10-CM

## 2015-12-31 DIAGNOSIS — C3412 Malignant neoplasm of upper lobe, left bronchus or lung: Secondary | ICD-10-CM

## 2015-12-31 DIAGNOSIS — I2510 Atherosclerotic heart disease of native coronary artery without angina pectoris: Secondary | ICD-10-CM | POA: Insufficient documentation

## 2015-12-31 DIAGNOSIS — Z9221 Personal history of antineoplastic chemotherapy: Secondary | ICD-10-CM | POA: Diagnosis not present

## 2015-12-31 DIAGNOSIS — J449 Chronic obstructive pulmonary disease, unspecified: Secondary | ICD-10-CM | POA: Insufficient documentation

## 2015-12-31 DIAGNOSIS — E119 Type 2 diabetes mellitus without complications: Secondary | ICD-10-CM | POA: Insufficient documentation

## 2015-12-31 LAB — COMPREHENSIVE METABOLIC PANEL
ALK PHOS: 84 U/L (ref 38–126)
ALT: 14 U/L (ref 14–54)
ANION GAP: 7 (ref 5–15)
AST: 14 U/L — ABNORMAL LOW (ref 15–41)
Albumin: 3.7 g/dL (ref 3.5–5.0)
BUN: 20 mg/dL (ref 6–20)
CALCIUM: 9.6 mg/dL (ref 8.9–10.3)
CO2: 34 mmol/L — AB (ref 22–32)
CREATININE: 1.43 mg/dL — AB (ref 0.44–1.00)
Chloride: 94 mmol/L — ABNORMAL LOW (ref 101–111)
GFR, EST AFRICAN AMERICAN: 45 mL/min — AB (ref >60)
GFR, EST NON AFRICAN AMERICAN: 39 mL/min — AB (ref >60)
Glucose, Bld: 150 mg/dL — ABNORMAL HIGH (ref 65–99)
Potassium: 3.8 mmol/L (ref 3.5–5.1)
SODIUM: 135 mmol/L (ref 135–145)
TOTAL PROTEIN: 7.5 g/dL (ref 6.5–8.1)
Total Bilirubin: 0.4 mg/dL (ref 0.3–1.2)

## 2015-12-31 MED ORDER — HYDROCODONE-ACETAMINOPHEN 5-325 MG PO TABS: 1.0000 | ORAL_TABLET | Freq: Four times a day (QID) | ORAL | Status: DC | PRN

## 2015-12-31 MED ORDER — ALPRAZOLAM 0.25 MG PO TABS: 0.2500 mg | ORAL_TABLET | Freq: Two times a day (BID) | ORAL | Status: AC | PRN

## 2015-12-31 NOTE — Progress Notes (Signed)
Requesting refill for Alprazolam and Vicodin.

## 2016-01-02 ENCOUNTER — Encounter: Payer: Self-pay | Admitting: Oncology

## 2016-01-02 NOTE — Progress Notes (Signed)
Kristine Escobar @ [facility] [phone]  Fax:([phone]) [place]: 07/30/1956  MR#: [id number]  HQR#:[id number]  Patient Care Team: Kristine Axe, MD as PCP - General (Internal Medicine)  CHIEF COMPLAINT:  Chief Complaint  Patient presents with  . Lung Cancer   Adenocarcinoma of lung stage IV. [id number]_ pT_N_M_ Stage Grouping:4 Cancer Status:   prior treatment. Started in December 2009 with carboplatinum and Alimta.  Last dose of carboplatinum was in March of 2010.  Last dose of maintenance Alimta was in June, 29 th  of 2011.chemotherapy discontinued because disease was stable for a long time and patient was getting extremely fatigued 2. Re- staging PET scan in September of 2012, shows progressive disease 3. Chemotherapy with carboplatinum Alimta was started in September of 2012 4. Patient has progressive disease based on PET scan in September of 2013 and will be started on carboplatinum and gemcitabine plus minus Avastin 5. VERISTRAT test is positive(good) 6. Carboplatinum reaction (October 17, 2012)chemotherapy was discontinued 7. Started on Tarceva (November, 2013)  8.Palliative radiation therapy  therapy to left supraclavicular area. 9.Tarceva was discontinued because of progressive disease by PET scan criteria.  November 2 014 10Progressive disease by PET scan.  Patient started on Taxotere and [name] December 16, 2014 11.  Poor tolerance to chemotherapy so patient was started on NIVOLULAMAB 12.  Progressing disease on V[place].  January 2 017 13.  Palliative radiation therapy to the spine (January, 2017" HPI:      No history exists.    Oncology Flowsheet 09/07/2015 09/21/2015 10/05/2015 10/19/2015 11/02/2015 11/23/2015 12/07/2015  nivolumab (OPDIVO) IV 300 mg 300 mg 300 mg 240 mg 240 mg 240 mg 240 mg    INTERVAL HISTORY 60 year old lady with stage IV carcinoma of lung Patient smokes smoked heavily in last few days  Did not buy any  inhalers The patient is complaining of low back pain and right hip pain.  Over-the-counter medication is not helping with the pain. Continues to smoke. Patient had this PET scan done which shows progressive disease.  PET scan has been reviewed independently.  And reviewed with the patient Sore NIVOLULAMAB has been discontinued because of progressive disease Back pain continues. REVIEW OF SYSTEMS:   GENERAL:  Feels good.  Active.  No fevers, sweats or weight loss. PERFORMANCE STATUS (ECOG):1 HEENT:  No visual changes, runny nose, sore throat, mouth sores or tenderness. Lungs: No shortness of breath or cough.  No hemoptysis. Cardiac:  No chest pain, palpitations, orthopnea, or PND. GI:  No nausea, vomiting, diarrhea, constipation, melena or hematochezia. GU:  No urgency, frequency, dysuria, or hematuria. Musculoskeletal:  Low back pain and pain radiating down to right hip Extremities:  No pain or swelling. Skin:  No rashes or skin changes. Neuro:  No headache, numbness or weakness, balance or coordination issues. Endocrine:  No diabetes, thyroid issues, hot flashes or night sweats. Psych:  No mood changes, depression or anxiety. Pain:  Right lower quadrant pain.  Right upper chest wall pain.  Does not require any pain medication takes  Occasional  Tylenol Review of systems:  All other systems reviewed and found to be negative.  As per HPI. Otherwise, a complete review of systems is negatve.  PAST MEDICAL HISTORY: Past Medical History  Diagnosis Date  . Cancer of lung ([place]) 05/07/2015   Diabetes. COPD on oxygen PAST SURGICAL HISTORY: Has been reviewed from previous notes FAMILY HISTORY No family history of lung cancer for colon cancer, ovarian cancer  ADVANCED DIRECTIVES: Patient does not have any advanced healthcare directive. Information has been given.  HEALTH MAINTENANCE: Social History  Substance Use Topics  . Smoking status: Current Every Day Smoker  . Smokeless  tobacco: None  . Alcohol Use: None      Allergies  Allergen Reactions  . Codeine Nausea And Vomiting    Current Outpatient Prescriptions  Medication Sig Dispense Refill  . albuterol (PROVENTIL HFA;VENTOLIN HFA) 108 (90 BASE) MCG/ACT inhaler Inhale 1-2 puffs into the lungs every 4 (four) hours as needed for wheezing or shortness of breath. 1 Inhaler 3  . ALPRAZolam (XANAX) 0.25 MG tablet Take 1 tablet (0.25 mg total) by mouth 2 (two) times daily as needed for anxiety. 60 tablet 1  . citalopram (CELEXA) 20 MG tablet Take by mouth.    . fluticasone-salmeterol (ADVAIR HFA) 115-21 MCG/ACT inhaler Inhale into the lungs.    . gabapentin (NEURONTIN) 300 MG capsule Take by mouth.    . hydrochlorothiazide (HYDRODIURIL) 25 MG tablet Take by mouth.    Marland Kitchen HYDROcodone-acetaminophen (NORCO/VICODIN) 5-325 MG tablet Take 1 tablet by mouth every 6 (six) hours as needed for moderate pain. 30 tablet 0  . insulin lispro (HUMALOG) 100 UNIT/ML injection Inject into the skin 3 (three) times daily before meals.    . Insulin NPH, Human,, Isophane, (HUMULIN N) 100 UNIT/ML Kiwkpen Inject 20 units subcutaneously every morning and 36 units every night.    . potassium chloride SA (K-DUR,KLOR-CON) 20 MEQ tablet Take 1 tablet (20 mEq total) by mouth daily. 30 tablet 0  . quinapril (ACCUPRIL) 40 MG tablet Take by mouth.     No current facility-administered medications for this visit.   Facility-Administered Medications Ordered in Other Visits  Medication Dose Route Frequency Provider Last Rate Last Dose  . sodium chloride 0.9 % injection 10 mL  10 mL Intracatheter PRN Forest Gleason, MD        OBJECTIVE:  Filed Vitals:   12/31/15 0948  BP: 143/79  Pulse: 111  Temp: 97 F (36.1 C)     Body mass index is 36.47 kg/(m^2).    ECOG FS:1 - Symptomatic but completely ambulatory  PHYSICAL EXAM Gen. status: Patient is alert oriented not in acute distress on oxygen . Moderately obese lady on oxygen Lungs: COPD.   Emphysematous chest.  Oxygen dependent. Bilateral rhonchi. Cardiac  Tachycardia Abdomen: Soft.  No ascites.  Liver spleen not palpable Neurological system: Higher functions within normal limit.  No focal sign Lower extremity no edema Skin: No rash Lymphatic system: Palpable left supraclavicular node decreased in size Psychiatric system: Patient is less anxious LAB RESULTS:  Appointment on 12/31/2015  Component Date Value Ref Range Status  . Sodium 12/31/2015 135  135 - 145 mmol/L Final  . Potassium 12/31/2015 3.8  3.5 - 5.1 mmol/L Final  . Chloride 12/31/2015 94* 101 - 111 mmol/L Final  . CO2 12/31/2015 34* 22 - 32 mmol/L Final  . Glucose, Bld 12/31/2015 150* 65 - 99 mg/dL Final  . BUN 12/31/2015 20  6 - 20 mg/dL Final  . Creatinine, Ser 12/31/2015 1.43* 0.44 - 1.00 mg/dL Final  . Calcium 12/31/2015 9.6  8.9 - 10.3 mg/dL Final  . Total Protein 12/31/2015 7.5  6.5 - 8.1 g/dL Final  . Albumin 12/31/2015 3.7  3.5 - 5.0 g/dL Final  . AST 12/31/2015 14* 15 - 41 U/L Final  . ALT 12/31/2015 14  14 - 54 U/L Final  . Alkaline Phosphatase 12/31/2015 84  38 - 126 U/L  Final  . Total Bilirubin 12/31/2015 0.4  0.3 - 1.2 mg/dL Final  . GFR calc non Af Amer 12/31/2015 39* >60 mL/min Final  . GFR calc Af Amer 12/31/2015 45* >60 mL/min Final   Comment: (NOTE) The eGFR has been calculated using the CKD EPI equation. This calculation has not been validated in all clinical situations. eGFR's persistently <60 mL/min signify possible Chronic Kidney Disease.   Georgiann Hahn gap 12/31/2015 7  5 - 15 Final  Hospital Outpatient Visit on 12/30/2015  Component Date Value Ref Range Status  . Glucose-Capillary 12/30/2015 151* 65 - 99 mg/dL Final   December 30, 2015 (PET scan) There is new focal hypermetabolism in the right clavicular head with an SUV max of 8.8, new. A lytic lesion in the right L5 vertebral body measures 3.0 cm, new.  IMPRESSION: 1. Progressive stage IV lung cancer as evidenced by  worsening mediastinal and bi hilar adenopathy, enlarging bilateral pulmonary nodules and new osseous metastatic disease. 2. Three-vessel coronary artery calcification. 3. Left adrenal adenoma.   ASSESSMENT: Stage IV carcinoma of lung Obesity BMI is 37 Chronic smoker Diabetes On oxygen Low back pain pain radiating down to lower extremity MEDICAL DECISION MAKING:   PET scan has been reviewed independently.  And reviewed with the patient.  There is is small hypermetabolic area in L5 which is causing pain.  Will refer patient for radiation oncology review and possibility of palliative radiation therapy  Condition is progressing on NIVOLULAMAB which will be discontinued after radiation therapy is finished possibility of starting XGEVA and chemotherapy can be considered. Possibility of gemcitabine and Taxotere may been options of treatment.  All these options have been discussed with the patient   Management of pain Continue Vicodin.  Instruction regarding constipation was given  No matching staging information was found for the patient.  Forest Gleason, MD   01/02/2016 11:16 AM

## 2016-01-04 ENCOUNTER — Ambulatory Visit: Payer: Medicare Other | Admitting: Radiation Oncology

## 2016-01-05 ENCOUNTER — Encounter: Payer: Self-pay | Admitting: Radiation Oncology

## 2016-01-05 ENCOUNTER — Ambulatory Visit
Admission: RE | Admit: 2016-01-05 | Discharge: 2016-01-05 | Disposition: A | Discharge status: Home / Self Care | Payer: Medicare Other | Source: Ambulatory Visit | Attending: Radiation Oncology | Admitting: Radiation Oncology

## 2016-01-05 ENCOUNTER — Ambulatory Visit: Payer: Medicare Other | Admitting: Radiation Oncology

## 2016-01-05 VITALS — BP 119/72 | HR 99 | Temp 98.0°F | Wt 216.4 lb

## 2016-01-05 DIAGNOSIS — C799 Secondary malignant neoplasm of unspecified site: Secondary | ICD-10-CM

## 2016-01-05 DIAGNOSIS — C7951 Secondary malignant neoplasm of bone: Secondary | ICD-10-CM | POA: Insufficient documentation

## 2016-01-05 DIAGNOSIS — C3490 Malignant neoplasm of unspecified part of unspecified bronchus or lung: Secondary | ICD-10-CM | POA: Insufficient documentation

## 2016-01-05 DIAGNOSIS — Z510 Encounter for antineoplastic radiation therapy: Secondary | ICD-10-CM | POA: Insufficient documentation

## 2016-01-05 NOTE — Progress Notes (Signed)
Radiation Oncology Follow up Note  Name: Kristine Escobar   Date:   01/05/2016 MRN:  [id number] DOB: 03-09-1956    This 60 y.o. 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  REFERRING PROVIDER: [name], MD  HPI: Patient is a 60 year old female well known to our department with a history of stage IV lung cancer dating back to December 2009. She's been treated with multiple chemotherapy interventions recently back in 2014 had radiation therapy to her left supraclavicular fossa for progressive disease. She is now presenting with increasing lower back pain recent PET CT scan demonstrates hypermetabolic metastatic disease measuring prostate 3 cm in the L5 lumbar body. She has been on Nivolulamab which has recently been discontinued. They're considering other systemic chemotherapy following radiation. She is ambulating well. She does have a hypermetabolic area in her right supraclavicular fossa although no symptoms at this time.  COMPLICATIONS OF TREATMENT: none  FOLLOW UP COMPLIANCE: keeps appointments   PHYSICAL EXAM:  BP 119/72 mmHg  Pulse 99  Temp(Src) 98 F (36.7 C)  Wt 216 lb 6.1 oz (98.15 kg) Well-developed slightly obese female in NAD. Range of motion of her lower extremities does not elicit pain. Deep palpation of her lower back does not elicit pain motor sensory and DTR levels are equal and symmetric in the upper and lower extremities. Well-developed well-nourished patient in NAD. HEENT reveals PERLA, EOMI, discs not visualized.  Oral cavity is clear. No oral mucosal lesions are identified. Neck is clear without evidence of cervical or supraclavicular adenopathy. Lungs are clear to A&P. Cardiac examination is essentially unremarkable with regular rate and rhythm without murmur rub or thrill. Abdomen is benign with no organomegaly or masses noted. Motor sensory and DTR levels are equal and symmetric in the upper and lower  extremities. Cranial nerves II through XII are grossly intact. Proprioception is intact. No peripheral adenopathy or edema is identified. No motor or sensory levels are noted. Crude visual fields are within normal range.  RADIOLOGY RESULTS: PET CT scans are reviewed  PLAN: At this time like to go ahead with palliative radiation therapy to her lumbar spine. We'll plan on delivering 3000 cGy in 10 fractions. I have set her up for CT simulation tomorrow. Risks and benefits of treatment including possible skin reaction fatigue alteration of blood counts and possibility of diarrhea all were described in detail with the patient. She seems to comprehend my treatment plan well.  I would like to take this opportunity for allowing me to participate in the care of your patient.Armstead Peaks., MD

## 2016-01-06 ENCOUNTER — Ambulatory Visit
Admission: RE | Admit: 2016-01-06 | Discharge: 2016-01-06 | Disposition: A | Discharge status: Home / Self Care | Payer: Medicare Other | Source: Ambulatory Visit | Attending: Radiation Oncology | Admitting: Radiation Oncology

## 2016-01-06 DIAGNOSIS — Z510 Encounter for antineoplastic radiation therapy: Secondary | ICD-10-CM | POA: Diagnosis not present

## 2016-01-06 DIAGNOSIS — C7951 Secondary malignant neoplasm of bone: Secondary | ICD-10-CM | POA: Diagnosis present

## 2016-01-06 DIAGNOSIS — C3490 Malignant neoplasm of unspecified part of unspecified bronchus or lung: Secondary | ICD-10-CM | POA: Diagnosis not present

## 2016-01-07 ENCOUNTER — Ambulatory Visit
Admission: RE | Admit: 2016-01-07 | Discharge: 2016-01-07 | Disposition: A | Discharge status: Home / Self Care | Payer: Medicare Other | Source: Ambulatory Visit | Attending: Oncology | Admitting: Oncology

## 2016-01-07 DIAGNOSIS — C3490 Malignant neoplasm of unspecified part of unspecified bronchus or lung: Secondary | ICD-10-CM

## 2016-01-07 DIAGNOSIS — Z1231 Encounter for screening mammogram for malignant neoplasm of breast: Secondary | ICD-10-CM | POA: Diagnosis not present

## 2016-01-07 DIAGNOSIS — C3412 Malignant neoplasm of upper lobe, left bronchus or lung: Secondary | ICD-10-CM

## 2016-01-08 ENCOUNTER — Other Ambulatory Visit: Payer: Self-pay | Admitting: *Deleted

## 2016-01-08 DIAGNOSIS — C7951 Secondary malignant neoplasm of bone: Secondary | ICD-10-CM

## 2016-01-12 ENCOUNTER — Ambulatory Visit
Admission: RE | Admit: 2016-01-12 | Discharge: 2016-01-12 | Disposition: A | Discharge status: Home / Self Care | Payer: Medicare Other | Source: Ambulatory Visit | Attending: Radiation Oncology | Admitting: Radiation Oncology

## 2016-01-12 ENCOUNTER — Other Ambulatory Visit: Payer: Self-pay | Admitting: Family Medicine

## 2016-01-12 DIAGNOSIS — C7951 Secondary malignant neoplasm of bone: Secondary | ICD-10-CM | POA: Diagnosis not present

## 2016-01-12 DIAGNOSIS — C3412 Malignant neoplasm of upper lobe, left bronchus or lung: Secondary | ICD-10-CM

## 2016-01-12 MED ORDER — HYDROCODONE-ACETAMINOPHEN 5-325 MG PO TABS: 1.0000 | ORAL_TABLET | ORAL | Status: DC | PRN

## 2016-01-13 ENCOUNTER — Ambulatory Visit
Admission: RE | Admit: 2016-01-13 | Discharge: 2016-01-13 | Disposition: A | Discharge status: Home / Self Care | Payer: Medicare Other | Source: Ambulatory Visit | Attending: Radiation Oncology | Admitting: Radiation Oncology

## 2016-01-13 DIAGNOSIS — C7951 Secondary malignant neoplasm of bone: Secondary | ICD-10-CM | POA: Diagnosis not present

## 2016-01-14 ENCOUNTER — Ambulatory Visit
Admission: RE | Admit: 2016-01-14 | Discharge: 2016-01-14 | Disposition: A | Discharge status: Home / Self Care | Payer: Medicare Other | Source: Ambulatory Visit | Attending: Radiation Oncology | Admitting: Radiation Oncology

## 2016-01-14 ENCOUNTER — Inpatient Hospital Stay: Payer: Medicare Other

## 2016-01-14 ENCOUNTER — Inpatient Hospital Stay (HOSPITAL_BASED_OUTPATIENT_CLINIC_OR_DEPARTMENT_OTHER): Payer: Medicare Other | Admitting: Oncology

## 2016-01-14 ENCOUNTER — Encounter: Payer: Self-pay | Admitting: Oncology

## 2016-01-14 VITALS — BP 117/76 | HR 96 | Temp 96.4°F | Wt 214.5 lb

## 2016-01-14 DIAGNOSIS — Z79899 Other long term (current) drug therapy: Secondary | ICD-10-CM

## 2016-01-14 DIAGNOSIS — C7951 Secondary malignant neoplasm of bone: Secondary | ICD-10-CM | POA: Diagnosis not present

## 2016-01-14 DIAGNOSIS — C3412 Malignant neoplasm of upper lobe, left bronchus or lung: Secondary | ICD-10-CM

## 2016-01-14 DIAGNOSIS — D3502 Benign neoplasm of left adrenal gland: Secondary | ICD-10-CM | POA: Diagnosis not present

## 2016-01-14 DIAGNOSIS — C3490 Malignant neoplasm of unspecified part of unspecified bronchus or lung: Secondary | ICD-10-CM

## 2016-01-14 DIAGNOSIS — Z6837 Body mass index (BMI) 37.0-37.9, adult: Secondary | ICD-10-CM

## 2016-01-14 DIAGNOSIS — E669 Obesity, unspecified: Secondary | ICD-10-CM | POA: Diagnosis not present

## 2016-01-14 DIAGNOSIS — F17210 Nicotine dependence, cigarettes, uncomplicated: Secondary | ICD-10-CM

## 2016-01-14 LAB — COMPREHENSIVE METABOLIC PANEL
ALBUMIN: 3.5 g/dL (ref 3.5–5.0)
ALK PHOS: 100 U/L (ref 38–126)
ALT: 33 U/L (ref 14–54)
AST: 25 U/L (ref 15–41)
Anion gap: 6 (ref 5–15)
BILIRUBIN TOTAL: 0.4 mg/dL (ref 0.3–1.2)
BUN: 19 mg/dL (ref 6–20)
CALCIUM: 9.5 mg/dL (ref 8.9–10.3)
CO2: 35 mmol/L — ABNORMAL HIGH (ref 22–32)
Chloride: 95 mmol/L — ABNORMAL LOW (ref 101–111)
Creatinine, Ser: 1.36 mg/dL — ABNORMAL HIGH (ref 0.44–1.00)
GFR calc Af Amer: 48 mL/min — ABNORMAL LOW (ref >60)
GFR calc non Af Amer: 41 mL/min — ABNORMAL LOW (ref >60)
GLUCOSE: 125 mg/dL — AB (ref 65–99)
Potassium: 3.8 mmol/L (ref 3.5–5.1)
Sodium: 136 mmol/L (ref 135–145)
TOTAL PROTEIN: 7.5 g/dL (ref 6.5–8.1)

## 2016-01-14 LAB — CBC WITH DIFFERENTIAL/PLATELET
Basophils Absolute: 0.1 10*3/uL (ref 0–0.1)
Basophils Relative: 1 %
EOS ABS: 0.1 10*3/uL (ref 0–0.7)
EOS PCT: 2 %
HCT: 47.3 % — ABNORMAL HIGH (ref 35.0–47.0)
Hemoglobin: 15.6 g/dL (ref 12.0–16.0)
LYMPHS ABS: 1.3 10*3/uL (ref 1.0–3.6)
LYMPHS PCT: 17 %
MCH: 30.5 pg (ref 26.0–34.0)
MCHC: 33 g/dL (ref 32.0–36.0)
MCV: 92.3 fL (ref 80.0–100.0)
MONO ABS: 0.5 10*3/uL (ref 0.2–0.9)
Monocytes Relative: 7 %
Neutro Abs: 5.7 10*3/uL (ref 1.4–6.5)
Neutrophils Relative %: 73 %
PLATELETS: 194 10*3/uL (ref 150–440)
RBC: 5.13 MIL/uL (ref 3.80–5.20)
RDW: 17.4 % — AB (ref 11.5–14.5)
WBC: 7.7 10*3/uL (ref 3.6–11.0)

## 2016-01-14 MED ORDER — FENTANYL 12 MCG/HR TD PT72: 12.0000 ug | MEDICATED_PATCH | TRANSDERMAL | Status: DC

## 2016-01-14 NOTE — Progress Notes (Signed)
[place] @ [facility] [phone]) [phone]  Fax:(336) [place]: Sep 11, 1956  MR#: [id number]  HFG#:[id number]  Patient Care Team: [name], MD as PCP - General (Internal Medicine)  CHIEF COMPLAINT:  Chief Complaint  Patient presents with  . Lung Cancer   Adenocarcinoma of lung stage IV. cT4_N3_M1_ pT_N_M_ Stage Grouping:4 Cancer Status:   prior treatment. Started in December 2009 with carboplatinum and Alimta.  Last dose of carboplatinum was in March of 2010.  Last dose of maintenance Alimta was in June, 29 th  of 2011.chemotherapy discontinued because disease was stable for a long time and patient was getting extremely fatigued 2. Re- staging PET scan in September of 2012, shows progressive disease 3. Chemotherapy with carboplatinum Alimta was started in September of 2012 4. Patient has progressive disease based on PET scan in September of 2013 and will be started on carboplatinum and gemcitabine plus minus Avastin 5. VERISTRAT test is positive(good) 6. Carboplatinum reaction (October 17, 2012)chemotherapy was discontinued 7. Started on Tarceva (November, 2013)  8.Palliative radiation therapy  therapy to left supraclavicular area. 9.Tarceva was discontinued because of progressive disease by PET scan criteria.  November 2 014 10Progressive disease by PET scan.  Patient started on Taxotere and [facility] December 16, 2014 11.  Poor tolerance to chemotherapy so patient was started on NIVOLULAMAB 12.  Progressing disease on [place].  January 2 017 13.  Palliative radiation therapy to the spine (January, 2017) HPI:      No history exists.    Oncology Flowsheet 09/07/2015 09/21/2015 10/05/2015 10/19/2015 11/02/2015 11/23/2015 12/07/2015  nivolumab (OPDIVO) IV 300 mg 300 mg 300 mg 240 mg 240 mg 240 mg 240 mg    INTERVAL HISTORY 60 year old lady with stage IV carcinoma of lung Patient smokes smoked heavily in last few days  Did not buy any  inhalers The patient is complaining of low back pain and right hip pain.  Over-the-counter medication is not helping with the pain. Continues to smoke. Patient had this PET scan done which shows progressive disease.  PET scan has been reviewed independently.  And reviewed with the patient So NIVOLULAMAB has been discontinued because of progressive disease Back pain continues..  Patient came as an acute and on with increasing pain in the right low sacroiliac joint area patient started radiation therapy but pain is continuous total aching taking Vicodin without much relief. In is not radiating into the legs REVIEW OF SYSTEMS:   GENERAL:  Feels good.  Active.  No fevers, sweats or weight loss. PERFORMANCE STATUS (ECOG):1 HEENT:  No visual changes, runny nose, sore throat, mouth sores or tenderness. Lungs: No shortness of breath or cough.  No hemoptysis. Cardiac:  No chest pain, palpitations, orthopnea, or PND. GI:  No nausea, vomiting, diarrhea, constipation, melena or hematochezia. GU:  No urgency, frequency, dysuria, or hematuria. Musculoskeletal:  Low back pain and pain radiating down to right hip Extremities:  No pain or swelling. Skin:  No rashes or skin changes. Neuro:  No headache, numbness or weakness, balance or coordination issues. Endocrine:  No diabetes, thyroid issues, hot flashes or night sweats. Psych:  No mood changes, depression or anxiety. Pain:  Right lower quadrant pain.  Right upper chest wall pain.  Does not require any pain medication takes  Occasional  Tylenol Review of systems:  All other systems reviewed and found to be negative.  As per HPI. Otherwise, a complete review of systems is negatve.  PAST MEDICAL HISTORY: Past Medical History  Diagnosis Date  . Cancer of lung ([place]) 05/07/2015   Diabetes. COPD on oxygen PAST SURGICAL HISTORY: Has been reviewed from previous notes FAMILY HISTORY No family history of lung cancer for colon cancer, ovarian  cancer     ADVANCED DIRECTIVES: Patient does not have any advanced healthcare directive. Information has been given.  HEALTH MAINTENANCE: Social History  Substance Use Topics  . Smoking status: Current Every Day Smoker  . Smokeless tobacco: None  . Alcohol Use: None      Allergies  Allergen Reactions  . Codeine Nausea And Vomiting    Current Outpatient Prescriptions  Medication Sig Dispense Refill  . albuterol (PROVENTIL HFA;VENTOLIN HFA) 108 (90 BASE) MCG/ACT inhaler Inhale 1-2 puffs into the lungs every 4 (four) hours as needed for wheezing or shortness of breath. 1 Inhaler 3  . ALPRAZolam (XANAX) 0.25 MG tablet Take 1 tablet (0.25 mg total) by mouth 2 (two) times daily as needed for anxiety. 60 tablet 1  . citalopram (CELEXA) 20 MG tablet Take by mouth.    . fluticasone-salmeterol (ADVAIR HFA) 115-21 MCG/ACT inhaler Inhale into the lungs.    . gabapentin (NEURONTIN) 300 MG capsule Take by mouth.    . hydrochlorothiazide (HYDRODIURIL) 25 MG tablet Take by mouth.    Marland Kitchen HYDROcodone-acetaminophen (NORCO/VICODIN) 5-325 MG tablet Take 1 tablet by mouth every 4 (four) hours as needed for moderate pain. 90 tablet 0  . insulin lispro (HUMALOG) 100 UNIT/ML injection Inject into the skin 3 (three) times daily before meals.    . Insulin NPH, Human,, Isophane, (HUMULIN N) 100 UNIT/ML Kiwkpen Inject 20 units subcutaneously every morning and 36 units every night.    . potassium chloride SA (K-DUR,KLOR-CON) 20 MEQ tablet Take 1 tablet (20 mEq total) by mouth daily. 30 tablet 0  . quinapril (ACCUPRIL) 40 MG tablet Take by mouth.    . fentaNYL (DURAGESIC - DOSED MCG/HR) 12 MCG/HR Place 1 patch (12.5 mcg total) onto the skin every 3 (three) days. 5 patch 0   No current facility-administered medications for this visit.   Facility-Administered Medications Ordered in Other Visits  Medication Dose Route Frequency Provider Last Rate Last Dose  . sodium chloride 0.9 % injection 10 mL  10 mL  Intracatheter PRN Forest Gleason, MD        OBJECTIVE:  Filed Vitals:   01/14/16 1024  BP: 117/76  Pulse: 96  Temp: 96.4 F (35.8 C)     Body mass index is 36.18 kg/(m^2).    ECOG FS:1 - Symptomatic but completely ambulatory  PHYSICAL EXAM Gen. status: Patient is alert oriented not in acute distress on oxygen . Moderately obese lady on oxygen Lungs: COPD.  Emphysematous chest.  Oxygen dependent. Bilateral rhonchi. Cardiac  Tachycardia Abdomen: Soft.  No ascites.  Liver spleen not palpable Neurological system: Higher functions within normal limit.  No focal sign Lower extremity no edema Skin: No rash Lymphatic system: Palpable left supraclavicular node decreased in size Psychiatric system: Patient is less anxious LAB RESULTS:  Appointment on 01/14/2016  Component Date Value Ref Range Status  . WBC 01/14/2016 7.7  3.6 - 11.0 K/uL Final  . RBC 01/14/2016 5.13  3.80 - 5.20 MIL/uL Final  . Hemoglobin 01/14/2016 15.6  12.0 - 16.0 g/dL Final  . HCT 01/14/2016 47.3* 35.0 - 47.0 % Final  . MCV 01/14/2016 92.3  80.0 - 100.0 fL Final  . MCH 01/14/2016 30.5  26.0 - 34.0 pg Final  . MCHC 01/14/2016 33.0  32.0 - 36.0 g/dL  Final  . RDW 01/14/2016 17.4* 11.5 - 14.5 % Final  . Platelets 01/14/2016 194  150 - 440 K/uL Final  . Neutrophils Relative % 01/14/2016 73   Final  . Neutro Abs 01/14/2016 5.7  1.4 - 6.5 K/uL Final  . Lymphocytes Relative 01/14/2016 17   Final  . Lymphs Abs 01/14/2016 1.3  1.0 - 3.6 K/uL Final  . Monocytes Relative 01/14/2016 7   Final  . Monocytes Absolute 01/14/2016 0.5  0.2 - 0.9 K/uL Final  . Eosinophils Relative 01/14/2016 2   Final  . Eosinophils Absolute 01/14/2016 0.1  0 - 0.7 K/uL Final  . Basophils Relative 01/14/2016 1   Final  . Basophils Absolute 01/14/2016 0.1  0 - 0.1 K/uL Final  . Sodium 01/14/2016 136  135 - 145 mmol/L Final  . Potassium 01/14/2016 3.8  3.5 - 5.1 mmol/L Final  . Chloride 01/14/2016 95* 101 - 111 mmol/L Final  . CO2 01/14/2016 35*  22 - 32 mmol/L Final  . Glucose, Bld 01/14/2016 125* 65 - 99 mg/dL Final  . BUN 01/14/2016 19  6 - 20 mg/dL Final  . Creatinine, Ser 01/14/2016 1.36* 0.44 - 1.00 mg/dL Final  . Calcium 01/14/2016 9.5  8.9 - 10.3 mg/dL Final  . Total Protein 01/14/2016 7.5  6.5 - 8.1 g/dL Final  . Albumin 01/14/2016 3.5  3.5 - 5.0 g/dL Final  . AST 01/14/2016 25  15 - 41 U/L Final  . ALT 01/14/2016 33  14 - 54 U/L Final  . Alkaline Phosphatase 01/14/2016 100  38 - 126 U/L Final  . Total Bilirubin 01/14/2016 0.4  0.3 - 1.2 mg/dL Final  . GFR calc non Af Amer 01/14/2016 41* >60 mL/min Final  . GFR calc Af Amer 01/14/2016 48* >60 mL/min Final   Comment: (NOTE) The eGFR has been calculated using the CKD EPI equation. This calculation has not been validated in all clinical situations. eGFR's persistently <60 mL/min signify possible Chronic Kidney Disease.   . Anion gap 01/14/2016 6  5 - 15 Final   December 30, 2015 (PET scan) There is new focal hypermetabolism in the right clavicular head with an SUV max of 8.8, new. A lytic lesion in the right L5 vertebral body measures 3.0 cm, new.  IMPRESSION: 1. Progressive stage IV lung cancer as evidenced by worsening mediastinal and bi hilar adenopathy, enlarging bilateral pulmonary nodules and new osseous metastatic disease. 2. Three-vessel coronary artery calcification. 3. Left adrenal adenoma.   ASSESSMENT: Stage IV carcinoma of lung Obesity BMI is 37 Chronic smoker Diabetes On oxygen Low back pain not controlled with present medication  She was started on fentanyl patch 12 g.  We will take breakthrough pain medication with   vicodin  Has started radiation therapy.  We will also get approval for XGEVA and XGEVA would be given next week.  (Because of bone metastases) Calcium is within acceptable range This was accompanied with her sister.  Need for using pain medication on more regular basis to radiation is effective has been explained to the  patient.  No matching staging information was found for the patient.  Forest Gleason, MD   01/14/2016 7:12 PM

## 2016-01-14 NOTE — Progress Notes (Signed)
Patient started radiation to her back yesterday.  States she is in a lot of pain today 9/10.  Wants to know if MD will give her rx for Fentanyl patches?

## 2016-01-15 ENCOUNTER — Ambulatory Visit
Admission: RE | Admit: 2016-01-15 | Discharge: 2016-01-15 | Disposition: A | Discharge status: Home / Self Care | Payer: Medicare Other | Source: Ambulatory Visit | Attending: Radiation Oncology | Admitting: Radiation Oncology

## 2016-01-15 ENCOUNTER — Ambulatory Visit: Payer: Medicare Other

## 2016-01-18 ENCOUNTER — Ambulatory Visit
Admission: RE | Admit: 2016-01-18 | Discharge: 2016-01-18 | Disposition: A | Discharge status: Home / Self Care | Payer: Medicare Other | Source: Ambulatory Visit | Attending: Radiation Oncology | Admitting: Radiation Oncology

## 2016-01-18 DIAGNOSIS — C7951 Secondary malignant neoplasm of bone: Secondary | ICD-10-CM | POA: Diagnosis not present

## 2016-01-19 ENCOUNTER — Inpatient Hospital Stay: Payer: Medicare Other

## 2016-01-19 ENCOUNTER — Ambulatory Visit
Admission: RE | Admit: 2016-01-19 | Discharge: 2016-01-19 | Disposition: A | Discharge status: Home / Self Care | Payer: Medicare Other | Source: Ambulatory Visit | Attending: Radiation Oncology | Admitting: Radiation Oncology

## 2016-01-19 DIAGNOSIS — C3490 Malignant neoplasm of unspecified part of unspecified bronchus or lung: Secondary | ICD-10-CM | POA: Diagnosis not present

## 2016-01-19 DIAGNOSIS — C3412 Malignant neoplasm of upper lobe, left bronchus or lung: Secondary | ICD-10-CM

## 2016-01-19 DIAGNOSIS — C7951 Secondary malignant neoplasm of bone: Secondary | ICD-10-CM | POA: Diagnosis not present

## 2016-01-19 LAB — CBC WITH DIFFERENTIAL/PLATELET
BASOS ABS: 0 10*3/uL (ref 0–0.1)
Basophils Relative: 1 %
EOS PCT: 1 %
Eosinophils Absolute: 0.1 10*3/uL (ref 0–0.7)
HEMATOCRIT: 45.5 % (ref 35.0–47.0)
Hemoglobin: 14.8 g/dL (ref 12.0–16.0)
LYMPHS ABS: 1 10*3/uL (ref 1.0–3.6)
LYMPHS PCT: 14 %
MCH: 30.5 pg (ref 26.0–34.0)
MCHC: 32.6 g/dL (ref 32.0–36.0)
MCV: 93.5 fL (ref 80.0–100.0)
MONO ABS: 0.5 10*3/uL (ref 0.2–0.9)
Monocytes Relative: 7 %
NEUTROS ABS: 5.3 10*3/uL (ref 1.4–6.5)
Neutrophils Relative %: 77 %
PLATELETS: 192 10*3/uL (ref 150–440)
RBC: 4.86 MIL/uL (ref 3.80–5.20)
RDW: 17.4 % — AB (ref 11.5–14.5)
WBC: 6.9 10*3/uL (ref 3.6–11.0)

## 2016-01-20 ENCOUNTER — Ambulatory Visit
Admission: RE | Admit: 2016-01-20 | Discharge: 2016-01-20 | Disposition: A | Discharge status: Home / Self Care | Payer: Medicare Other | Source: Ambulatory Visit | Attending: Radiation Oncology | Admitting: Radiation Oncology

## 2016-01-20 DIAGNOSIS — C7951 Secondary malignant neoplasm of bone: Secondary | ICD-10-CM | POA: Diagnosis not present

## 2016-01-21 ENCOUNTER — Inpatient Hospital Stay: Payer: Medicare Other

## 2016-01-21 ENCOUNTER — Ambulatory Visit
Admission: RE | Admit: 2016-01-21 | Discharge: 2016-01-21 | Disposition: A | Discharge status: Home / Self Care | Payer: Medicare Other | Source: Ambulatory Visit | Attending: Radiation Oncology | Admitting: Radiation Oncology

## 2016-01-21 DIAGNOSIS — C3412 Malignant neoplasm of upper lobe, left bronchus or lung: Secondary | ICD-10-CM

## 2016-01-21 DIAGNOSIS — C7951 Secondary malignant neoplasm of bone: Secondary | ICD-10-CM | POA: Diagnosis not present

## 2016-01-21 DIAGNOSIS — C3490 Malignant neoplasm of unspecified part of unspecified bronchus or lung: Secondary | ICD-10-CM | POA: Diagnosis not present

## 2016-01-21 MED ORDER — DENOSUMAB 120 MG/1.7ML ~~LOC~~ SOLN
120.0000 mg | Freq: Once | SUBCUTANEOUS | Status: AC
  Administered 2016-01-21: 120 mg via SUBCUTANEOUS
  Filled 2016-01-21: qty 1.7

## 2016-01-22 ENCOUNTER — Ambulatory Visit
Admission: RE | Admit: 2016-01-22 | Discharge: 2016-01-22 | Disposition: A | Discharge status: Home / Self Care | Payer: Medicare Other | Source: Ambulatory Visit | Attending: Radiation Oncology | Admitting: Radiation Oncology

## 2016-01-22 DIAGNOSIS — C7951 Secondary malignant neoplasm of bone: Secondary | ICD-10-CM | POA: Diagnosis not present

## 2016-01-25 ENCOUNTER — Encounter: Payer: Self-pay | Admitting: Oncology

## 2016-01-25 ENCOUNTER — Ambulatory Visit
Admission: RE | Admit: 2016-01-25 | Discharge: 2016-01-25 | Disposition: A | Discharge status: Home / Self Care | Payer: Medicare Other | Source: Ambulatory Visit | Attending: Oncology | Admitting: Oncology

## 2016-01-25 ENCOUNTER — Telehealth: Payer: Self-pay | Admitting: *Deleted

## 2016-01-25 ENCOUNTER — Ambulatory Visit
Admission: RE | Admit: 2016-01-25 | Discharge: 2016-01-25 | Disposition: A | Discharge status: Home / Self Care | Payer: Medicare Other | Source: Ambulatory Visit | Attending: Radiation Oncology | Admitting: Radiation Oncology

## 2016-01-25 ENCOUNTER — Inpatient Hospital Stay: Payer: Medicare Other

## 2016-01-25 ENCOUNTER — Ambulatory Visit: Admission: RE | Admit: 2016-01-25 | Payer: Medicare Other | Source: Ambulatory Visit

## 2016-01-25 ENCOUNTER — Inpatient Hospital Stay (HOSPITAL_BASED_OUTPATIENT_CLINIC_OR_DEPARTMENT_OTHER): Payer: Medicare Other | Admitting: Oncology

## 2016-01-25 VITALS — BP 130/79 | HR 85 | Temp 96.9°F | Resp 18

## 2016-01-25 DIAGNOSIS — M25571 Pain in right ankle and joints of right foot: Secondary | ICD-10-CM | POA: Diagnosis not present

## 2016-01-25 DIAGNOSIS — C3490 Malignant neoplasm of unspecified part of unspecified bronchus or lung: Secondary | ICD-10-CM | POA: Diagnosis not present

## 2016-01-25 DIAGNOSIS — D3502 Benign neoplasm of left adrenal gland: Secondary | ICD-10-CM | POA: Diagnosis not present

## 2016-01-25 DIAGNOSIS — E669 Obesity, unspecified: Secondary | ICD-10-CM

## 2016-01-25 DIAGNOSIS — Z6837 Body mass index (BMI) 37.0-37.9, adult: Secondary | ICD-10-CM

## 2016-01-25 DIAGNOSIS — F17210 Nicotine dependence, cigarettes, uncomplicated: Secondary | ICD-10-CM

## 2016-01-25 DIAGNOSIS — C7951 Secondary malignant neoplasm of bone: Secondary | ICD-10-CM

## 2016-01-25 DIAGNOSIS — C3412 Malignant neoplasm of upper lobe, left bronchus or lung: Secondary | ICD-10-CM

## 2016-01-25 DIAGNOSIS — H532 Diplopia: Secondary | ICD-10-CM

## 2016-01-25 DIAGNOSIS — M7731 Calcaneal spur, right foot: Secondary | ICD-10-CM | POA: Insufficient documentation

## 2016-01-25 DIAGNOSIS — S99911A Unspecified injury of right ankle, initial encounter: Secondary | ICD-10-CM | POA: Diagnosis not present

## 2016-01-25 DIAGNOSIS — Z79899 Other long term (current) drug therapy: Secondary | ICD-10-CM

## 2016-01-25 DIAGNOSIS — H538 Other visual disturbances: Secondary | ICD-10-CM

## 2016-01-25 LAB — COMPREHENSIVE METABOLIC PANEL
ALBUMIN: 3.3 g/dL — AB (ref 3.5–5.0)
ALT: 71 U/L — AB (ref 14–54)
ANION GAP: 8 (ref 5–15)
AST: 38 U/L (ref 15–41)
Alkaline Phosphatase: 132 U/L — ABNORMAL HIGH (ref 38–126)
BUN: 18 mg/dL (ref 6–20)
CHLORIDE: 97 mmol/L — AB (ref 101–111)
CO2: 28 mmol/L (ref 22–32)
Calcium: 8.1 mg/dL — ABNORMAL LOW (ref 8.9–10.3)
Creatinine, Ser: 1.29 mg/dL — ABNORMAL HIGH (ref 0.44–1.00)
GFR calc non Af Amer: 44 mL/min — ABNORMAL LOW (ref >60)
GFR, EST AFRICAN AMERICAN: 51 mL/min — AB (ref >60)
Glucose, Bld: 65 mg/dL (ref 65–99)
Potassium: 4.2 mmol/L (ref 3.5–5.1)
SODIUM: 133 mmol/L — AB (ref 135–145)
Total Bilirubin: 0.9 mg/dL (ref 0.3–1.2)
Total Protein: 7.1 g/dL (ref 6.5–8.1)

## 2016-01-25 MED ORDER — FENTANYL 12 MCG/HR TD PT72: 12.0000 ug | MEDICATED_PATCH | TRANSDERMAL | Status: DC

## 2016-01-25 NOTE — Progress Notes (Signed)
Patient here today as acute add on for blurred vision and blacking out episodes.  States she fell yesterday morning and injured her right ankle. Patient sleeping a lot during the day and then sleeps all night. Patient requesting refill for fentanyl.

## 2016-01-25 NOTE — Progress Notes (Signed)
[place] @ [facility] : [facility] Telephone:(336) [phone]  Fax:(336) [place]: [birth year]  MR#: [id number]  ION#:[id number]  Patient Care Team: [name], MD as PCP - General (Internal Medicine)  CHIEF COMPLAINT:  Chief Complaint  Patient presents with  . Lung Cancer   Adenocarcinoma of lung stage IV. cT4_N3_M1_ pT_N_M_ Stage Grouping:4 Cancer Status:   prior treatment. Started in December 2009 with carboplatinum and Alimta.  Last dose of carboplatinum was in March of 2010.  Last dose of maintenance Alimta was in June, 29 th  of 2011.chemotherapy discontinued because disease was stable for a long time and patient was getting extremely fatigued 2. Re- staging PET scan in September of 2012, shows progressive disease 3. Chemotherapy with carboplatinum Alimta was started in September of 2012 4. Patient has progressive disease based on PET scan in September of 2013 and will be started on carboplatinum and gemcitabine plus minus Avastin 5. VERISTRAT test is positive(good) 6. Carboplatinum reaction (October 17, 2012)chemotherapy was discontinued 7. Started on Tarceva (November, 2013)  8.Palliative radiation therapy  therapy to left supraclavicular area. 9.Tarceva was discontinued because of progressive disease by PET scan criteria.  November 2 014 10Progressive disease by PET scan.  Patient started on Taxotere and [facility] December 16, 2014 11.  Poor tolerance to chemotherapy so patient was started on NIVOLULAMAB 12.  Progressing disease on [place].  January 2 017 13.  Palliative radiation therapy to the spine (January, 2017)  HPI:      No history exists.    Oncology Flowsheet 09/21/2015 10/05/2015 10/19/2015 11/02/2015 11/23/2015 12/07/2015 01/21/2016  denosumab (XGEVA) Manuel Garcia - - - - - - 120 mg  nivolumab (OPDIVO) IV 300 mg 300 mg 240 mg 240 mg 240 mg 240 mg -    INTERVAL HISTORY 60 year old lady with stage IV carcinoma of lung Patient smokes smoked heavily  in last few days. Patient is here for ongoing evaluation and treatment consideration. The patient complains of blurred vision.  No headache.  Also fell and twisted right ankle.  Significant pain and cannot bear weight  Patient complains of flashes of light in spots in front of the eyes No seizure activity   REVIEW OF SYSTEMS:   GENERAL:  Feels good.  Active.  No fevers, sweats or weight loss. PERFORMANCE STATUS (ECOG):1 HEENT:  No visual changes, runny nose, sore throat, mouth sores or tenderness. Lungs: No shortness of breath or cough.  No hemoptysis. Cardiac:  No chest pain, palpitations, orthopnea, or PND. GI:  No nausea, vomiting, diarrhea, constipation, melena or hematochezia. GU:  No urgency, frequency, dysuria, or hematuria. Musculoskeletal:  Low back pain and pain radiating down to right hip Extremities:  No pain or swelling. Skin:  No rashes or skin changes. Neuro:  No headache, numbness or weakness, balance or coordination issues. Endocrine:  No diabetes, thyroid issues, hot flashes or night sweats. Psych:  No mood changes, depression or anxiety. Pain:  Right lower quadrant pain.  Right upper chest wall pain.  Does not require any pain medication takes  Occasional  Tylenol Review of systems:  All other systems reviewed and found to be negative.  As per HPI. Otherwise, a complete review of systems is negatve.  PAST MEDICAL HISTORY: Past Medical History  Diagnosis Date  . Cancer of lung ([place]) 05/07/2015   Diabetes. COPD on oxygen PAST SURGICAL HISTORY: Has been reviewed from previous notes FAMILY HISTORY No family history of lung cancer for colon cancer, ovarian cancer  ADVANCED DIRECTIVES: Patient does not have any advanced healthcare directive. Information has been given.  HEALTH MAINTENANCE: Social History  Substance Use Topics  . Smoking status: Current Every Day Smoker  . Smokeless tobacco: None  . Alcohol Use: None      Allergies  Allergen  Reactions  . Codeine Nausea And Vomiting    Current Outpatient Prescriptions  Medication Sig Dispense Refill  . albuterol (PROVENTIL HFA;VENTOLIN HFA) 108 (90 BASE) MCG/ACT inhaler Inhale 1-2 puffs into the lungs every 4 (four) hours as needed for wheezing or shortness of breath. 1 Inhaler 3  . ALPRAZolam (XANAX) 0.25 MG tablet Take 1 tablet (0.25 mg total) by mouth 2 (two) times daily as needed for anxiety. 60 tablet 1  . citalopram (CELEXA) 20 MG tablet Take by mouth.    . fentaNYL (DURAGESIC - DOSED MCG/HR) 12 MCG/HR Place 1 patch (12.5 mcg total) onto the skin every 3 (three) days. 10 patch 0  . fluticasone-salmeterol (ADVAIR HFA) 115-21 MCG/ACT inhaler Inhale into the lungs.    . gabapentin (NEURONTIN) 300 MG capsule Take by mouth.    . hydrochlorothiazide (HYDRODIURIL) 25 MG tablet Take by mouth.    Marland Kitchen HYDROcodone-acetaminophen (NORCO/VICODIN) 5-325 MG tablet Take 1 tablet by mouth every 4 (four) hours as needed for moderate pain. 90 tablet 0  . insulin lispro (HUMALOG) 100 UNIT/ML injection Inject into the skin 3 (three) times daily before meals.    . Insulin NPH, Human,, Isophane, (HUMULIN N) 100 UNIT/ML Kiwkpen Inject 20 units subcutaneously every morning and 36 units every night.    . potassium chloride SA (K-DUR,KLOR-CON) 20 MEQ tablet Take 1 tablet (20 mEq total) by mouth daily. 30 tablet 0  . quinapril (ACCUPRIL) 40 MG tablet Take by mouth.     No current facility-administered medications for this visit.   Facility-Administered Medications Ordered in Other Visits  Medication Dose Route Frequency Provider Last Rate Last Dose  . sodium chloride 0.9 % injection 10 mL  10 mL Intracatheter PRN Forest Gleason, MD        OBJECTIVE:  Filed Vitals:   01/25/16 1201  BP: 130/79  Pulse: 85  Temp: 96.9 F (36.1 C)  Resp: 18     There is no weight on file to calculate BMI.    ECOG FS:1 - Symptomatic but completely ambulatory  PHYSICAL EXAM Gen. status: Patient is alert oriented not  in acute distress on oxygen . Moderately obese lady on oxygen Lungs: COPD.  Emphysematous chest.  Oxygen dependent. Bilateral rhonchi. Cardiac  Tachycardia. Musculoskeletal system tenderness in the right ankle with swelling Abdomen: Soft.  No ascites.  Liver spleen not palpable Neurological system: Higher functions within normal limit.  No focal sign Lower extremity no edema Skin: No rash Lymphatic system: Palpable left supraclavicular node decreased in size Psychiatric system: Patient is less anxious LAB RESULTS:  No visits with results within 3 Day(s) from this visit. Latest known visit with results is:  Appointment on 01/19/2016  Component Date Value Ref Range Status  . WBC 01/19/2016 6.9  3.6 - 11.0 K/uL Final  . RBC 01/19/2016 4.86  3.80 - 5.20 MIL/uL Final  . Hemoglobin 01/19/2016 14.8  12.0 - 16.0 g/dL Final  . HCT 01/19/2016 45.5  35.0 - 47.0 % Final  . MCV 01/19/2016 93.5  80.0 - 100.0 fL Final  . MCH 01/19/2016 30.5  26.0 - 34.0 pg Final  . MCHC 01/19/2016 32.6  32.0 - 36.0 g/dL Final  . RDW 01/19/2016 17.4* 11.5 -  14.5 % Final  . Platelets 01/19/2016 192  150 - 440 K/uL Final  . Neutrophils Relative % 01/19/2016 77   Final  . Neutro Abs 01/19/2016 5.3  1.4 - 6.5 K/uL Final  . Lymphocytes Relative 01/19/2016 14   Final  . Lymphs Abs 01/19/2016 1.0  1.0 - 3.6 K/uL Final  . Monocytes Relative 01/19/2016 7   Final  . Monocytes Absolute 01/19/2016 0.5  0.2 - 0.9 K/uL Final  . Eosinophils Relative 01/19/2016 1   Final  . Eosinophils Absolute 01/19/2016 0.1  0 - 0.7 K/uL Final  . Basophils Relative 01/19/2016 1   Final  . Basophils Absolute 01/19/2016 0.0  0 - 0.1 K/uL Final   December 30, 2015 (PET scan) There is new focal hypermetabolism in the right clavicular head with an SUV max of 8.8, new. A lytic lesion in the right L5 vertebral body measures 3.0 cm, new.  IMPRESSION: 1. Progressive stage IV lung cancer as evidenced by worsening mediastinal and bi hilar  adenopathy, enlarging bilateral pulmonary nodules and new osseous metastatic disease. 2. Three-vessel coronary artery calcification. 3. Left adrenal adenoma.   ASSESSMENT: Stage IV carcinoma of lung Obesity BMI is 37 Chronic smoker Diabetes On oxygen 2, low back pain has been improving after radiation was started\ 3.  New neurological symptoms are bothersome and we will proceed with either CT or MRI scan of brain any metastases to the brain 4.  We will also ask for a right ankle to rule out any fracture Reevaluate patient after all this information is available.. Family member was present. Some of the neurological symptoms may be associated with hypoglycemia so patient has been asked to check blood sugar when neurological symptoms occurs.  Forest Gleason, MD   01/25/2016 12:09 PM

## 2016-01-25 NOTE — Telephone Encounter (Signed)
Called to report that she fell last night and has blurred vision. See md per Dr Oliva Bustard. Pt on her way

## 2016-01-26 ENCOUNTER — Ambulatory Visit
Admission: RE | Admit: 2016-01-26 | Discharge: 2016-01-26 | Disposition: A | Discharge status: Home / Self Care | Payer: Medicare Other | Source: Ambulatory Visit | Attending: Radiation Oncology | Admitting: Radiation Oncology

## 2016-01-26 ENCOUNTER — Ambulatory Visit: Payer: Medicare Other

## 2016-01-26 DIAGNOSIS — C7951 Secondary malignant neoplasm of bone: Secondary | ICD-10-CM | POA: Diagnosis not present

## 2016-01-27 ENCOUNTER — Ambulatory Visit: Payer: Medicare Other

## 2016-01-27 ENCOUNTER — Ambulatory Visit
Admission: RE | Admit: 2016-01-27 | Discharge: 2016-01-27 | Disposition: A | Discharge status: Home / Self Care | Payer: Medicare Other | Source: Ambulatory Visit | Attending: Radiation Oncology | Admitting: Radiation Oncology

## 2016-01-27 DIAGNOSIS — C7951 Secondary malignant neoplasm of bone: Secondary | ICD-10-CM | POA: Diagnosis not present

## 2016-01-28 ENCOUNTER — Telehealth: Payer: Self-pay | Admitting: *Deleted

## 2016-01-28 ENCOUNTER — Ambulatory Visit
Admission: RE | Admit: 2016-01-28 | Discharge: 2016-01-28 | Disposition: A | Discharge status: Home / Self Care | Payer: Medicare Other | Source: Ambulatory Visit | Attending: Radiation Oncology | Admitting: Radiation Oncology

## 2016-01-28 DIAGNOSIS — C7951 Secondary malignant neoplasm of bone: Secondary | ICD-10-CM | POA: Diagnosis not present

## 2016-01-28 NOTE — Telephone Encounter (Signed)
Called to inquire about an appt for what she thinks is a weekly injection for her bones. Please advise

## 2016-01-28 NOTE — Telephone Encounter (Signed)
Per Dr Oliva Bustard, she will get Delton See once a month. I left message on pt VM that she is to get inj once a month and not weekly

## 2016-02-02 ENCOUNTER — Ambulatory Visit: Payer: Medicare Other | Admitting: Oncology

## 2016-02-02 ENCOUNTER — Other Ambulatory Visit: Payer: Medicare Other

## 2016-02-04 ENCOUNTER — Inpatient Hospital Stay: Payer: Medicare Other | Attending: Oncology

## 2016-02-04 ENCOUNTER — Ambulatory Visit
Admission: RE | Admit: 2016-02-04 | Discharge: 2016-02-04 | Disposition: A | Discharge status: Home / Self Care | Payer: Medicare Other | Source: Ambulatory Visit | Attending: Oncology | Admitting: Oncology

## 2016-02-04 ENCOUNTER — Inpatient Hospital Stay (HOSPITAL_BASED_OUTPATIENT_CLINIC_OR_DEPARTMENT_OTHER): Payer: Medicare Other | Admitting: Oncology

## 2016-02-04 VITALS — BP 135/85 | HR 93 | Temp 97.2°F | Resp 18

## 2016-02-04 DIAGNOSIS — I2510 Atherosclerotic heart disease of native coronary artery without angina pectoris: Secondary | ICD-10-CM | POA: Diagnosis not present

## 2016-02-04 DIAGNOSIS — F17210 Nicotine dependence, cigarettes, uncomplicated: Secondary | ICD-10-CM | POA: Insufficient documentation

## 2016-02-04 DIAGNOSIS — Z9221 Personal history of antineoplastic chemotherapy: Secondary | ICD-10-CM

## 2016-02-04 DIAGNOSIS — C3412 Malignant neoplasm of upper lobe, left bronchus or lung: Secondary | ICD-10-CM | POA: Insufficient documentation

## 2016-02-04 DIAGNOSIS — Z993 Dependence on wheelchair: Secondary | ICD-10-CM | POA: Diagnosis not present

## 2016-02-04 DIAGNOSIS — R51 Headache: Secondary | ICD-10-CM | POA: Diagnosis not present

## 2016-02-04 DIAGNOSIS — E119 Type 2 diabetes mellitus without complications: Secondary | ICD-10-CM | POA: Insufficient documentation

## 2016-02-04 DIAGNOSIS — I739 Peripheral vascular disease, unspecified: Secondary | ICD-10-CM | POA: Diagnosis not present

## 2016-02-04 DIAGNOSIS — C3490 Malignant neoplasm of unspecified part of unspecified bronchus or lung: Secondary | ICD-10-CM | POA: Diagnosis not present

## 2016-02-04 DIAGNOSIS — E669 Obesity, unspecified: Secondary | ICD-10-CM | POA: Insufficient documentation

## 2016-02-04 DIAGNOSIS — H532 Diplopia: Secondary | ICD-10-CM | POA: Insufficient documentation

## 2016-02-04 DIAGNOSIS — C7951 Secondary malignant neoplasm of bone: Secondary | ICD-10-CM

## 2016-02-04 DIAGNOSIS — Z6837 Body mass index (BMI) 37.0-37.9, adult: Secondary | ICD-10-CM

## 2016-02-04 DIAGNOSIS — M25571 Pain in right ankle and joints of right foot: Secondary | ICD-10-CM | POA: Insufficient documentation

## 2016-02-04 DIAGNOSIS — M545 Low back pain: Secondary | ICD-10-CM | POA: Insufficient documentation

## 2016-02-04 DIAGNOSIS — Z794 Long term (current) use of insulin: Secondary | ICD-10-CM | POA: Diagnosis not present

## 2016-02-04 DIAGNOSIS — Z79899 Other long term (current) drug therapy: Secondary | ICD-10-CM | POA: Insufficient documentation

## 2016-02-04 DIAGNOSIS — R9089 Other abnormal findings on diagnostic imaging of central nervous system: Secondary | ICD-10-CM | POA: Diagnosis not present

## 2016-02-04 DIAGNOSIS — Z9981 Dependence on supplemental oxygen: Secondary | ICD-10-CM | POA: Diagnosis not present

## 2016-02-04 DIAGNOSIS — J449 Chronic obstructive pulmonary disease, unspecified: Secondary | ICD-10-CM | POA: Insufficient documentation

## 2016-02-04 LAB — CBC WITH DIFFERENTIAL/PLATELET
Basophils Absolute: 0.1 10*3/uL (ref 0–0.1)
Basophils Relative: 1 %
EOS PCT: 2 %
Eosinophils Absolute: 0.1 10*3/uL (ref 0–0.7)
HCT: 44.1 % (ref 35.0–47.0)
Hemoglobin: 14.4 g/dL (ref 12.0–16.0)
LYMPHS ABS: 1 10*3/uL (ref 1.0–3.6)
LYMPHS PCT: 17 %
MCH: 30.3 pg (ref 26.0–34.0)
MCHC: 32.5 g/dL (ref 32.0–36.0)
MCV: 93.1 fL (ref 80.0–100.0)
MONO ABS: 0.6 10*3/uL (ref 0.2–0.9)
Monocytes Relative: 10 %
Neutro Abs: 4.1 10*3/uL (ref 1.4–6.5)
Neutrophils Relative %: 70 %
PLATELETS: 163 10*3/uL (ref 150–440)
RBC: 4.74 MIL/uL (ref 3.80–5.20)
RDW: 18 % — ABNORMAL HIGH (ref 11.5–14.5)
WBC: 5.9 10*3/uL (ref 3.6–11.0)

## 2016-02-04 LAB — COMPREHENSIVE METABOLIC PANEL
ALBUMIN: 3.1 g/dL — AB (ref 3.5–5.0)
ALT: 40 U/L (ref 14–54)
AST: 25 U/L (ref 15–41)
Alkaline Phosphatase: 116 U/L (ref 38–126)
Anion gap: 6 (ref 5–15)
BUN: 17 mg/dL (ref 6–20)
CHLORIDE: 101 mmol/L (ref 101–111)
CO2: 32 mmol/L (ref 22–32)
Calcium: 8.9 mg/dL (ref 8.9–10.3)
Creatinine, Ser: 1.27 mg/dL — ABNORMAL HIGH (ref 0.44–1.00)
GFR calc Af Amer: 52 mL/min — ABNORMAL LOW (ref >60)
GFR calc non Af Amer: 45 mL/min — ABNORMAL LOW (ref >60)
GLUCOSE: 102 mg/dL — AB (ref 65–99)
POTASSIUM: 4.3 mmol/L (ref 3.5–5.1)
Sodium: 139 mmol/L (ref 135–145)
Total Bilirubin: 0.5 mg/dL (ref 0.3–1.2)
Total Protein: 6.9 g/dL (ref 6.5–8.1)

## 2016-02-04 MED ORDER — GADOBENATE DIMEGLUMINE 529 MG/ML IV SOLN
20.0000 mL | Freq: Once | INTRAVENOUS | Status: AC | PRN
  Administered 2016-02-04: 20 mL via INTRAVENOUS

## 2016-02-04 NOTE — Progress Notes (Signed)
Patient here for MRI results.

## 2016-02-07 ENCOUNTER — Encounter: Payer: Self-pay | Admitting: Oncology

## 2016-02-07 NOTE — Progress Notes (Signed)
[place] @ [facility] [phone]  Fax:(336) [place]: Jun 14, 1956  MR#: [id number]  QXI#:[id number]  Patient Care Team: [name], MD as PCP - General (Internal Medicine)  CHIEF COMPLAINT:  Chief Complaint  Patient presents with  . Lung Cancer   Adenocarcinoma of lung stage IV. cT4_N3_M1_ pT_N_M_ Stage Grouping:4 Cancer Status:   prior treatment. Started in December 2009 with carboplatinum and Alimta.  Last dose of carboplatinum was in March of 2010.  Last dose of maintenance Alimta was in June, 29 th  of 2011.chemotherapy discontinued because disease was stable for a long time and patient was getting extremely fatigued 2. Re- staging PET scan in September of 2012, shows progressive disease 3. Chemotherapy with carboplatinum Alimta was started in September of 2012 4. Patient has progressive disease based on PET scan in September of 2013 and will be started on carboplatinum and gemcitabine plus minus Avastin 5. VERISTRAT test is positive(good) 6. Carboplatinum reaction (October 17, 2012)chemotherapy was discontinued 7. Started on Tarceva (November, 2013)  8.Palliative radiation therapy  therapy to left supraclavicular area. 9.Tarceva was discontinued because of progressive disease by PET scan criteria.  November 2 014 10Progressive disease by PET scan.  Patient started on Taxotere and [facility] December 16, 2014 11.  Poor tolerance to chemotherapy so patient was started on NIVOLULAMAB 12.  Progressing disease on [place].  January 2 017 13.  Palliative radiation therapy to the spine (January, 2017) On XGEVA of bone metastases  HPI:      No history exists.    Oncology Flowsheet 09/21/2015 10/05/2015 10/19/2015 11/02/2015 11/23/2015 12/07/2015 01/21/2016  denosumab (XGEVA)  - - - - - - 120 mg  nivolumab (OPDIVO) IV 300 mg 300 mg 240 mg 240 mg 240 mg 240 mg -    INTERVAL HISTORY 60 year old lady with stage IV carcinoma of lung  Patient smokes smoked heavily in last few days. Patient is here for ongoing evaluation and treatment consideration. The patient complains of blurred vision.  No headache.  Also fell and twisted right ankle.  Significant pain and cannot bear weight  Patient complains of flashes of light in spots in front of the eyes No seizure activity Patient is here for further follow-up and MRI scan of the because of visual disturbances and headache.  Back pain is improved after radiation therapy Here for further evaluation and treatment consideration   REVIEW OF SYSTEMS:   GENERAL:  Feels good.  Active.  No fevers, sweats or weight loss. PERFORMANCE STATUS (ECOG):1 HEENT:  No visual changes, runny nose, sore throat, mouth sores or tenderness. Lungs: No shortness of breath or cough.  No hemoptysis. Cardiac:  No chest pain, palpitations, orthopnea, or PND. GI:  No nausea, vomiting, diarrhea, constipation, melena or hematochezia. GU:  No urgency, frequency, dysuria, or hematuria. Musculoskeletal:  Low back pain and pain radiating down to right hip Extremities:  No pain or swelling. Skin:  No rashes or skin changes. Neuro:  No headache, numbness or weakness, balance or coordination issues. Endocrine:  No diabetes, thyroid issues, hot flashes or night sweats. Psych:  No mood changes, depression or anxiety. Pain:  Right lower quadrant pain.  Right upper chest wall pain.  Does not require any pain medication takes  Occasional  Tylenol Review of systems:  All other systems reviewed and found to be negative.  As per HPI. Otherwise, a complete review of systems is negatve.  PAST MEDICAL HISTORY: Past Medical History  Diagnosis Date  .  Cancer of lung ([place]) 05/07/2015   Diabetes. COPD on oxygen PAST SURGICAL HISTORY: Has been reviewed from previous notes FAMILY HISTORY No family history of lung cancer for colon cancer, ovarian cancer     ADVANCED DIRECTIVES: Patient does not have any advanced  healthcare directive. Information has been given.  HEALTH MAINTENANCE: Social History  Substance Use Topics  . Smoking status: Current Every Day Smoker  . Smokeless tobacco: None  . Alcohol Use: None      Allergies  Allergen Reactions  . Codeine Nausea And Vomiting    Current Outpatient Prescriptions  Medication Sig Dispense Refill  . albuterol (PROVENTIL HFA;VENTOLIN HFA) 108 (90 BASE) MCG/ACT inhaler Inhale 1-2 puffs into the lungs every 4 (four) hours as needed for wheezing or shortness of breath. 1 Inhaler 3  . ALPRAZolam (XANAX) 0.25 MG tablet Take 1 tablet (0.25 mg total) by mouth 2 (two) times daily as needed for anxiety. 60 tablet 1  . citalopram (CELEXA) 20 MG tablet Take by mouth.    . fentaNYL (DURAGESIC - DOSED MCG/HR) 12 MCG/HR Place 1 patch (12.5 mcg total) onto the skin every 3 (three) days. 10 patch 0  . fluticasone-salmeterol (ADVAIR HFA) 115-21 MCG/ACT inhaler Inhale into the lungs.    . gabapentin (NEURONTIN) 300 MG capsule Take by mouth.    . insulin lispro (HUMALOG) 100 UNIT/ML injection Inject into the skin 3 (three) times daily before meals.    . potassium chloride SA (K-DUR,KLOR-CON) 20 MEQ tablet Take 1 tablet (20 mEq total) by mouth daily. 30 tablet 0  . quinapril (ACCUPRIL) 40 MG tablet Take by mouth.    . hydrochlorothiazide (HYDRODIURIL) 25 MG tablet Take by mouth. Reported on 02/04/2016    . HYDROcodone-acetaminophen (NORCO/VICODIN) 5-325 MG tablet Take 1 tablet by mouth every 4 (four) hours as needed for moderate pain. (Patient not taking: Reported on 02/04/2016) 90 tablet 0  . Insulin NPH, Human,, Isophane, (HUMULIN N) 100 UNIT/ML Kiwkpen Reported on 02/04/2016     No current facility-administered medications for this visit.   Facility-Administered Medications Ordered in Other Visits  Medication Dose Route Frequency Provider Last Rate Last Dose  . sodium chloride 0.9 % injection 10 mL  10 mL Intracatheter PRN Forest Gleason, MD         OBJECTIVE:  Filed Vitals:   02/04/16 1612  BP: 135/85  Pulse: 93  Temp: 97.2 F (36.2 C)  Resp: 18     There is no weight on file to calculate BMI.    ECOG FS:1 - Symptomatic but completely ambulatory  PHYSICAL EXAM Gen. status: Patient is alert oriented not in acute distress on oxygen . Moderately obese lady on oxygen Lungs: COPD.  Emphysematous chest.  Oxygen dependent. Bilateral rhonchi. Cardiac  Tachycardia. Musculoskeletal system tenderness in the right ankle with swelling Abdomen: Soft.  No ascites.  Liver spleen not palpable Neurological system: Higher functions within normal limit.  No focal sign Lower extremity no edema Skin: No rash Lymphatic system: Palpable left supraclavicular node decreased in size Psychiatric system: Patient is less anxious LAB RESULTS:  Appointment on 02/04/2016  Component Date Value Ref Range Status  . Sodium 02/04/2016 139  135 - 145 mmol/L Final  . Potassium 02/04/2016 4.3  3.5 - 5.1 mmol/L Final  . Chloride 02/04/2016 101  101 - 111 mmol/L Final  . CO2 02/04/2016 32  22 - 32 mmol/L Final  . Glucose, Bld 02/04/2016 102* 65 - 99 mg/dL Final  . BUN 02/04/2016 17  6 -  20 mg/dL Final  . Creatinine, Ser 02/04/2016 1.27* 0.44 - 1.00 mg/dL Final  . Calcium 02/04/2016 8.9  8.9 - 10.3 mg/dL Final  . Total Protein 02/04/2016 6.9  6.5 - 8.1 g/dL Final  . Albumin 02/04/2016 3.1* 3.5 - 5.0 g/dL Final  . AST 02/04/2016 25  15 - 41 U/L Final  . ALT 02/04/2016 40  14 - 54 U/L Final  . Alkaline Phosphatase 02/04/2016 116  38 - 126 U/L Final  . Total Bilirubin 02/04/2016 0.5  0.3 - 1.2 mg/dL Final  . GFR calc non Af Amer 02/04/2016 45* >60 mL/min Final  . GFR calc Af Amer 02/04/2016 52* >60 mL/min Final   Comment: (NOTE) The eGFR has been calculated using the CKD EPI equation. This calculation has not been validated in all clinical situations. eGFR's persistently <60 mL/min signify possible Chronic Kidney Disease.   . Anion gap 02/04/2016 6  5  - 15 Final  . WBC 02/04/2016 5.9  3.6 - 11.0 K/uL Final  . RBC 02/04/2016 4.74  3.80 - 5.20 MIL/uL Final  . Hemoglobin 02/04/2016 14.4  12.0 - 16.0 g/dL Final  . HCT 02/04/2016 44.1  35.0 - 47.0 % Final  . MCV 02/04/2016 93.1  80.0 - 100.0 fL Final  . MCH 02/04/2016 30.3  26.0 - 34.0 pg Final  . MCHC 02/04/2016 32.5  32.0 - 36.0 g/dL Final  . RDW 02/04/2016 18.0* 11.5 - 14.5 % Final  . Platelets 02/04/2016 163  150 - 440 K/uL Final  . Neutrophils Relative % 02/04/2016 70   Final  . Neutro Abs 02/04/2016 4.1  1.4 - 6.5 K/uL Final  . Lymphocytes Relative 02/04/2016 17   Final  . Lymphs Abs 02/04/2016 1.0  1.0 - 3.6 K/uL Final  . Monocytes Relative 02/04/2016 10   Final  . Monocytes Absolute 02/04/2016 0.6  0.2 - 0.9 K/uL Final  . Eosinophils Relative 02/04/2016 2   Final  . Eosinophils Absolute 02/04/2016 0.1  0 - 0.7 K/uL Final  . Basophils Relative 02/04/2016 1   Final  . Basophils Absolute 02/04/2016 0.1  0 - 0.1 K/uL Final   December 30, 2015 (PET scan) There is new focal hypermetabolism in the right clavicular head with an SUV max of 8.8, new. A lytic lesion in the right L5 vertebral body measures 3.0 cm, new.  IMPRESSION: 1. Progressive stage IV lung cancer as evidenced by worsening mediastinal and bi hilar adenopathy, enlarging bilateral pulmonary nodules and new osseous metastatic disease. 2. Three-vessel coronary artery calcification. 3. Left adrenal adenoma.   ASSESSMENT: Stage IV carcinoma of lung Obesity BMI is 37 Chronic smoker Diabetes On oxygen 2, low back pain has been improving after radiation was started\ 3.  New neurological symptoms are bothersome and we will proceed with either CT or MRI scan of brain any metastases to the brain MRI scan of brain did not reveal any evidence of metastases to the brain.  Patient has significant relief in pain after radiation therapy has been done patient also has been on XGEVA bone metastases Possibility of weekly Taxol on  weekly Taxotere can be considered.  This point I will continue to observe the patient. I scan has been reviewed independently Other options would be palliative care also discussed.  Sister was present during discussion. Total duration of visit was 30 minutes.  50% or more time was spent in counseling patient and family regarding prognosis and options of treatment and available resources   Forest Gleason, MD  02/07/2016 8:06 AM

## 2016-02-15 ENCOUNTER — Inpatient Hospital Stay: Payer: Medicare Other

## 2016-02-15 ENCOUNTER — Inpatient Hospital Stay (HOSPITAL_BASED_OUTPATIENT_CLINIC_OR_DEPARTMENT_OTHER): Payer: Medicare Other | Admitting: Oncology

## 2016-02-15 ENCOUNTER — Telehealth: Payer: Self-pay | Admitting: *Deleted

## 2016-02-15 ENCOUNTER — Encounter: Payer: Self-pay | Admitting: Oncology

## 2016-02-15 VITALS — BP 111/74 | HR 108 | Temp 97.9°F | Resp 18 | Wt 222.2 lb

## 2016-02-15 DIAGNOSIS — Z6837 Body mass index (BMI) 37.0-37.9, adult: Secondary | ICD-10-CM

## 2016-02-15 DIAGNOSIS — C3412 Malignant neoplasm of upper lobe, left bronchus or lung: Secondary | ICD-10-CM

## 2016-02-15 DIAGNOSIS — Z9221 Personal history of antineoplastic chemotherapy: Secondary | ICD-10-CM

## 2016-02-15 DIAGNOSIS — M545 Low back pain: Secondary | ICD-10-CM

## 2016-02-15 DIAGNOSIS — F17210 Nicotine dependence, cigarettes, uncomplicated: Secondary | ICD-10-CM

## 2016-02-15 DIAGNOSIS — E669 Obesity, unspecified: Secondary | ICD-10-CM

## 2016-02-15 DIAGNOSIS — C7951 Secondary malignant neoplasm of bone: Secondary | ICD-10-CM

## 2016-02-15 DIAGNOSIS — E119 Type 2 diabetes mellitus without complications: Secondary | ICD-10-CM

## 2016-02-15 DIAGNOSIS — M25571 Pain in right ankle and joints of right foot: Secondary | ICD-10-CM

## 2016-02-15 DIAGNOSIS — Z79899 Other long term (current) drug therapy: Secondary | ICD-10-CM

## 2016-02-15 DIAGNOSIS — R51 Headache: Secondary | ICD-10-CM

## 2016-02-15 DIAGNOSIS — H532 Diplopia: Secondary | ICD-10-CM

## 2016-02-15 DIAGNOSIS — C3490 Malignant neoplasm of unspecified part of unspecified bronchus or lung: Secondary | ICD-10-CM | POA: Diagnosis not present

## 2016-02-15 LAB — CBC WITH DIFFERENTIAL/PLATELET
BASOS ABS: 0.1 10*3/uL (ref 0–0.1)
Basophils Relative: 1 %
EOS PCT: 1 %
Eosinophils Absolute: 0.1 10*3/uL (ref 0–0.7)
HEMATOCRIT: 42.5 % (ref 35.0–47.0)
HEMOGLOBIN: 13.9 g/dL (ref 12.0–16.0)
LYMPHS PCT: 12 %
Lymphs Abs: 0.8 10*3/uL — ABNORMAL LOW (ref 1.0–3.6)
MCH: 29.8 pg (ref 26.0–34.0)
MCHC: 32.7 g/dL (ref 32.0–36.0)
MCV: 91.1 fL (ref 80.0–100.0)
Monocytes Absolute: 0.8 10*3/uL (ref 0.2–0.9)
Monocytes Relative: 12 %
NEUTROS PCT: 74 %
Neutro Abs: 5.2 10*3/uL (ref 1.4–6.5)
PLATELETS: 168 10*3/uL (ref 150–440)
RBC: 4.66 MIL/uL (ref 3.80–5.20)
RDW: 17.9 % — ABNORMAL HIGH (ref 11.5–14.5)
WBC: 6.9 10*3/uL (ref 3.6–11.0)

## 2016-02-15 LAB — COMPREHENSIVE METABOLIC PANEL
ALT: 16 U/L (ref 14–54)
ANION GAP: 5 (ref 5–15)
AST: 16 U/L (ref 15–41)
Albumin: 3.1 g/dL — ABNORMAL LOW (ref 3.5–5.0)
Alkaline Phosphatase: 91 U/L (ref 38–126)
BUN: 17 mg/dL (ref 6–20)
CALCIUM: 8.5 mg/dL — AB (ref 8.9–10.3)
CO2: 32 mmol/L (ref 22–32)
Chloride: 99 mmol/L — ABNORMAL LOW (ref 101–111)
Creatinine, Ser: 1.35 mg/dL — ABNORMAL HIGH (ref 0.44–1.00)
GFR calc Af Amer: 48 mL/min — ABNORMAL LOW (ref >60)
GFR, EST NON AFRICAN AMERICAN: 42 mL/min — AB (ref >60)
GLUCOSE: 192 mg/dL — AB (ref 65–99)
Potassium: 3.4 mmol/L — ABNORMAL LOW (ref 3.5–5.1)
SODIUM: 136 mmol/L (ref 135–145)
TOTAL PROTEIN: 6.4 g/dL — AB (ref 6.5–8.1)
Total Bilirubin: 0.6 mg/dL (ref 0.3–1.2)

## 2016-02-15 MED ORDER — FENTANYL 12 MCG/HR TD PT72: 12.0000 ug | MEDICATED_PATCH | TRANSDERMAL | Status: AC

## 2016-02-15 NOTE — Telephone Encounter (Signed)
Asking for prognosis with the new bone cancer. Fraser Din wants to know but she didn't know to ask

## 2016-02-15 NOTE — Telephone Encounter (Signed)
Per Dr Oliva Bustard, This is nothing to discuss over the phone , but will go over it at her appt next week. Nira Conn said she understands and will make sure [facility] Kidney knows to be sure it it done at next appt

## 2016-02-15 NOTE — Progress Notes (Signed)
[place] @ [facility] [phone]) [phone]  Fax:(336) [place]: [birth year]  MR#: [id number]  GYB#:[id number]  Patient Care Team: [name], MD as PCP - General (Internal Medicine)  CHIEF COMPLAINT:  Chief Complaint  Patient presents with  . Lung Cancer   Adenocarcinoma of lung stage IV. cT4_N3_M1_ pT_N_M_ Stage Grouping:4 Cancer Status:   prior treatment. Started in December 2009 with carboplatinum and Alimta.  Last dose of carboplatinum was in March of 2010.  Last dose of maintenance Alimta was in June, 29 th  of 2011.chemotherapy discontinued because disease was stable for a long time and patient was getting extremely fatigued 2. Re- staging PET scan in September of 2012, shows progressive disease 3. Chemotherapy with carboplatinum Alimta was started in September of 2012 4. Patient has progressive disease based on PET scan in September of 2013 and will be started on carboplatinum and gemcitabine plus minus Avastin 5. VERISTRAT test is positive(good) 6. Carboplatinum reaction (October 17, 2012)chemotherapy was discontinued 7. Started on Tarceva (November, 2013)  8.Palliative radiation therapy  therapy to left supraclavicular area. 9.Tarceva was discontinued because of progressive disease by PET scan criteria.  November 2 014 10Progressive disease by PET scan.  Patient started on Taxotere and [facility] December 16, 2014 11.  Poor tolerance to chemotherapy so patient was started on NIVOLULAMAB 12.  Progressing disease on [place].  January 2 017 13.  Palliative radiation therapy to the spine (January, 2017) On XGEVA   For  bone metastases  HPI:      No history exists.    Oncology Flowsheet 09/21/2015 10/05/2015 10/19/2015 11/02/2015 11/23/2015 12/07/2015 01/21/2016  denosumab (XGEVA) [place] - - - - - - 120 mg  nivolumab (OPDIVO) IV 300 mg 300 mg 240 mg 240 mg 240 mg 240 mg -    INTERVAL HISTORY 60 year old lady with stage IV carcinoma of  lung Patient smokes smoked heavily in last few days. Patient is here for further follow-up and treatment consideration.  Bony pains have improved.  No nausea no vomiting no diarrhea appetite has been improving. Patient still remains wheelchair confined.  Patient is using fentanyl patch here for further follow-up and treatment consideration   REVIEW OF SYSTEMS:   GENERAL:  Feels good.  Active.  No fevers, sweats or weight loss. PERFORMANCE STATUS (ECOG):1 HEENT:  No visual changes, runny nose, sore throat, mouth sores or tenderness. Lungs: No shortness of breath or cough.  No hemoptysis. Cardiac:  No chest pain, palpitations, orthopnea, or PND. GI:  No nausea, vomiting, diarrhea, constipation, melena or hematochezia. GU:  No urgency, frequency, dysuria, or hematuria. Musculoskeletal:  Low back pain and pain radiating down to right hip Extremities:  No pain or swelling. Skin:  No rashes or skin changes. Neuro:  No headache, numbness or weakness, balance or coordination issues. Endocrine:  No diabetes, thyroid issues, hot flashes or night sweats. Psych:  No mood changes, depression or anxiety. Pain:  Right lower quadrant pain.  Right upper chest wall pain.  Does not require any pain medication takes  Occasional  Tylenol Review of systems:  All other systems reviewed and found to be negative.  As per HPI. Otherwise, a complete review of systems is negatve.  PAST MEDICAL HISTORY: Past Medical History  Diagnosis Date  . Cancer of lung ([place]) 05/07/2015   Diabetes. COPD on oxygen PAST SURGICAL HISTORY: Has been reviewed from previous notes FAMILY HISTORY No family history of lung cancer for colon cancer, ovarian cancer  ADVANCED DIRECTIVES: Patient does not have any advanced healthcare directive. Information has been given.  HEALTH MAINTENANCE: Social History  Substance Use Topics  . Smoking status: Current Every Day Smoker  . Smokeless tobacco: None  . Alcohol Use: None       Allergies  Allergen Reactions  . Codeine Nausea And Vomiting    Current Outpatient Prescriptions  Medication Sig Dispense Refill  . albuterol (PROVENTIL HFA;VENTOLIN HFA) 108 (90 BASE) MCG/ACT inhaler Inhale 1-2 puffs into the lungs every 4 (four) hours as needed for wheezing or shortness of breath. 1 Inhaler 3  . ALPRAZolam (XANAX) 0.25 MG tablet Take 1 tablet (0.25 mg total) by mouth 2 (two) times daily as needed for anxiety. 60 tablet 1  . citalopram (CELEXA) 20 MG tablet Take by mouth.    . fentaNYL (DURAGESIC - DOSED MCG/HR) 12 MCG/HR Place 1 patch (12.5 mcg total) onto the skin every 3 (three) days. 10 patch 0  . fluticasone-salmeterol (ADVAIR HFA) 115-21 MCG/ACT inhaler Inhale into the lungs.    . gabapentin (NEURONTIN) 300 MG capsule Take by mouth.    . hydrochlorothiazide (HYDRODIURIL) 25 MG tablet Take by mouth. Reported on 02/04/2016    . HYDROcodone-acetaminophen (NORCO/VICODIN) 5-325 MG tablet Take 1 tablet by mouth every 4 (four) hours as needed for moderate pain. 90 tablet 0  . insulin lispro (HUMALOG) 100 UNIT/ML injection Inject into the skin 3 (three) times daily before meals.    . Insulin NPH, Human,, Isophane, (HUMULIN N) 100 UNIT/ML Kiwkpen Reported on 02/04/2016    . potassium chloride SA (K-DUR,KLOR-CON) 20 MEQ tablet Take 1 tablet (20 mEq total) by mouth daily. 30 tablet 0  . quinapril (ACCUPRIL) 40 MG tablet Take by mouth.     No current facility-administered medications for this visit.   Facility-Administered Medications Ordered in Other Visits  Medication Dose Route Frequency Provider Last Rate Last Dose  . sodium chloride 0.9 % injection 10 mL  10 mL Intracatheter PRN Forest Gleason, MD        OBJECTIVE:  Filed Vitals:   02/15/16 1346  BP: 111/74  Pulse: 108  Temp: 97.9 F (36.6 C)  Resp: 18     Body mass index is 37.48 kg/(m^2).    ECOG FS:1 - Symptomatic but completely ambulatory  PHYSICAL EXAM Gen. status: Patient is alert oriented not in  acute distress on oxygen . Moderately obese lady on oxygen Lungs: COPD.  Emphysematous chest.  Oxygen dependent. Bilateral rhonchi. Cardiac  Tachycardia. Musculoskeletal system tenderness in the right ankle with swelling Abdomen: Soft.  No ascites.  Liver spleen not palpable Neurological system: Higher functions within normal limit.  No focal sign Lower extremity no edema Skin: No rash Lymphatic system: Palpable left supraclavicular node decreased in size Psychiatric system: Patient is less anxious LAB RESULTS:  Appointment on 02/15/2016  Component Date Value Ref Range Status  . WBC 02/15/2016 6.9  3.6 - 11.0 K/uL Final  . RBC 02/15/2016 4.66  3.80 - 5.20 MIL/uL Final  . Hemoglobin 02/15/2016 13.9  12.0 - 16.0 g/dL Final  . HCT 02/15/2016 42.5  35.0 - 47.0 % Final  . MCV 02/15/2016 91.1  80.0 - 100.0 fL Final  . MCH 02/15/2016 29.8  26.0 - 34.0 pg Final  . MCHC 02/15/2016 32.7  32.0 - 36.0 g/dL Final  . RDW 02/15/2016 17.9* 11.5 - 14.5 % Final  . Platelets 02/15/2016 168  150 - 440 K/uL Final  . Neutrophils Relative % 02/15/2016 74   Final  .  Neutro Abs 02/15/2016 5.2  1.4 - 6.5 K/uL Final  . Lymphocytes Relative 02/15/2016 12   Final  . Lymphs Abs 02/15/2016 0.8* 1.0 - 3.6 K/uL Final  . Monocytes Relative 02/15/2016 12   Final  . Monocytes Absolute 02/15/2016 0.8  0.2 - 0.9 K/uL Final  . Eosinophils Relative 02/15/2016 1   Final  . Eosinophils Absolute 02/15/2016 0.1  0 - 0.7 K/uL Final  . Basophils Relative 02/15/2016 1   Final  . Basophils Absolute 02/15/2016 0.1  0 - 0.1 K/uL Final  . Sodium 02/15/2016 136  135 - 145 mmol/L Final  . Potassium 02/15/2016 3.4* 3.5 - 5.1 mmol/L Final  . Chloride 02/15/2016 99* 101 - 111 mmol/L Final  . CO2 02/15/2016 32  22 - 32 mmol/L Final  . Glucose, Bld 02/15/2016 192* 65 - 99 mg/dL Final  . BUN 02/15/2016 17  6 - 20 mg/dL Final  . Creatinine, Ser 02/15/2016 1.35* 0.44 - 1.00 mg/dL Final  . Calcium 02/15/2016 8.5* 8.9 - 10.3 mg/dL Final   . Total Protein 02/15/2016 6.4* 6.5 - 8.1 g/dL Final  . Albumin 02/15/2016 3.1* 3.5 - 5.0 g/dL Final  . AST 02/15/2016 16  15 - 41 U/L Final  . ALT 02/15/2016 16  14 - 54 U/L Final  . Alkaline Phosphatase 02/15/2016 91  38 - 126 U/L Final  . Total Bilirubin 02/15/2016 0.6  0.3 - 1.2 mg/dL Final  . GFR calc non Af Amer 02/15/2016 42* >60 mL/min Final  . GFR calc Af Amer 02/15/2016 48* >60 mL/min Final   Comment: (NOTE) The eGFR has been calculated using the CKD EPI equation. This calculation has not been validated in all clinical situations. eGFR's persistently <60 mL/min signify possible Chronic Kidney Disease.   . Anion gap 02/15/2016 5  5 - 15 Final   December 30, 2015 (PET scan) There is new focal hypermetabolism in the right clavicular head with an SUV max of 8.8, new. A lytic lesion in the right L5 vertebral body measures 3.0 cm, new.  IMPRESSION: 1. Progressive stage IV lung cancer as evidenced by worsening mediastinal and bi hilar adenopathy, enlarging bilateral pulmonary nodules and new osseous metastatic disease. 2. Three-vessel coronary artery calcification. 3. Left adrenal adenoma.   ASSESSMENT: Stage IV carcinoma of lung Obesity BMI is 37 Chronic smoker Diabetes On oxygen 2, low back pain has been improving after radiation was started\ Pain is improved.  Patient is here to discuss further options for therapy.  "Liquid biopsy" is pending. If you do not find any significant mutation which can be targeted then we will proceed with weekly Taxotere and gemcitabine therapy (Palliative nature of such chemotherapy is been discussed.  Consent has been obtained Forest Gleason, MD   02/15/2016 1:50 PM

## 2016-02-16 ENCOUNTER — Encounter: Payer: Self-pay | Admitting: Oncology

## 2016-02-18 ENCOUNTER — Encounter: Payer: Self-pay | Admitting: Family Medicine

## 2016-02-23 ENCOUNTER — Encounter: Payer: Self-pay | Admitting: Oncology

## 2016-02-23 ENCOUNTER — Inpatient Hospital Stay: Payer: Medicare Other

## 2016-02-23 ENCOUNTER — Inpatient Hospital Stay (HOSPITAL_BASED_OUTPATIENT_CLINIC_OR_DEPARTMENT_OTHER): Payer: Medicare Other | Admitting: Oncology

## 2016-02-23 VITALS — BP 135/79 | HR 105 | Temp 97.2°F | Resp 18 | Wt 222.0 lb

## 2016-02-23 DIAGNOSIS — C7951 Secondary malignant neoplasm of bone: Secondary | ICD-10-CM | POA: Diagnosis not present

## 2016-02-23 DIAGNOSIS — E669 Obesity, unspecified: Secondary | ICD-10-CM | POA: Diagnosis not present

## 2016-02-23 DIAGNOSIS — M545 Low back pain: Secondary | ICD-10-CM

## 2016-02-23 DIAGNOSIS — Z6837 Body mass index (BMI) 37.0-37.9, adult: Secondary | ICD-10-CM | POA: Diagnosis not present

## 2016-02-23 DIAGNOSIS — C3490 Malignant neoplasm of unspecified part of unspecified bronchus or lung: Secondary | ICD-10-CM

## 2016-02-23 DIAGNOSIS — Z79899 Other long term (current) drug therapy: Secondary | ICD-10-CM

## 2016-02-23 DIAGNOSIS — Z9221 Personal history of antineoplastic chemotherapy: Secondary | ICD-10-CM

## 2016-02-23 DIAGNOSIS — C3412 Malignant neoplasm of upper lobe, left bronchus or lung: Secondary | ICD-10-CM

## 2016-02-23 DIAGNOSIS — M25571 Pain in right ankle and joints of right foot: Secondary | ICD-10-CM

## 2016-02-23 DIAGNOSIS — E119 Type 2 diabetes mellitus without complications: Secondary | ICD-10-CM

## 2016-02-23 DIAGNOSIS — H532 Diplopia: Secondary | ICD-10-CM

## 2016-02-23 DIAGNOSIS — F17210 Nicotine dependence, cigarettes, uncomplicated: Secondary | ICD-10-CM

## 2016-02-23 DIAGNOSIS — R51 Headache: Secondary | ICD-10-CM

## 2016-02-23 LAB — CBC WITH DIFFERENTIAL/PLATELET
BASOS ABS: 0.1 10*3/uL (ref 0–0.1)
BASOS PCT: 1 %
EOS ABS: 0.1 10*3/uL (ref 0–0.7)
EOS PCT: 2 %
HCT: 45.5 % (ref 35.0–47.0)
Hemoglobin: 14.7 g/dL (ref 12.0–16.0)
Lymphocytes Relative: 11 %
Lymphs Abs: 0.6 10*3/uL — ABNORMAL LOW (ref 1.0–3.6)
MCH: 29.2 pg (ref 26.0–34.0)
MCHC: 32.3 g/dL (ref 32.0–36.0)
MCV: 90.4 fL (ref 80.0–100.0)
Monocytes Absolute: 0.4 10*3/uL (ref 0.2–0.9)
Monocytes Relative: 7 %
NEUTROS PCT: 79 %
Neutro Abs: 4.5 10*3/uL (ref 1.4–6.5)
PLATELETS: 185 10*3/uL (ref 150–440)
RBC: 5.03 MIL/uL (ref 3.80–5.20)
RDW: 18 % — ABNORMAL HIGH (ref 11.5–14.5)
WBC: 5.8 10*3/uL (ref 3.6–11.0)

## 2016-02-23 LAB — COMPREHENSIVE METABOLIC PANEL
ALT: 14 U/L (ref 14–54)
AST: 14 U/L — ABNORMAL LOW (ref 15–41)
Albumin: 3.3 g/dL — ABNORMAL LOW (ref 3.5–5.0)
Alkaline Phosphatase: 100 U/L (ref 38–126)
Anion gap: 3 — ABNORMAL LOW (ref 5–15)
BUN: 12 mg/dL (ref 6–20)
CO2: 32 mmol/L (ref 22–32)
Calcium: 8.4 mg/dL — ABNORMAL LOW (ref 8.9–10.3)
Chloride: 99 mmol/L — ABNORMAL LOW (ref 101–111)
Creatinine, Ser: 1.28 mg/dL — ABNORMAL HIGH (ref 0.44–1.00)
GFR calc Af Amer: 52 mL/min — ABNORMAL LOW (ref >60)
GFR calc non Af Amer: 45 mL/min — ABNORMAL LOW (ref >60)
Glucose, Bld: 135 mg/dL — ABNORMAL HIGH (ref 65–99)
Potassium: 3.9 mmol/L (ref 3.5–5.1)
Sodium: 134 mmol/L — ABNORMAL LOW (ref 135–145)
Total Bilirubin: 0.6 mg/dL (ref 0.3–1.2)
Total Protein: 6.8 g/dL (ref 6.5–8.1)

## 2016-02-23 LAB — MAGNESIUM: MAGNESIUM: 2 mg/dL (ref 1.7–2.4)

## 2016-02-23 MED ORDER — FAMOTIDINE IN NACL 20-0.9 MG/50ML-% IV SOLN
20.0000 mg | Freq: Once | INTRAVENOUS | Status: AC
  Administered 2016-02-23: 20 mg via INTRAVENOUS
  Filled 2016-02-23: qty 50

## 2016-02-23 MED ORDER — SODIUM CHLORIDE 0.9% FLUSH
10.0000 mL | INTRAVENOUS | Status: DC | PRN
  Filled 2016-02-23: qty 10

## 2016-02-23 MED ORDER — TRAMADOL HCL 50 MG PO TABS: 50.0000 mg | ORAL_TABLET | Freq: Four times a day (QID) | ORAL | Status: AC | PRN

## 2016-02-23 MED ORDER — SODIUM CHLORIDE 0.9 % IV SOLN
Freq: Once | INTRAVENOUS | Status: AC
  Administered 2016-02-23: 12:00:00 via INTRAVENOUS
  Filled 2016-02-23: qty 4

## 2016-02-23 MED ORDER — DENOSUMAB 120 MG/1.7ML ~~LOC~~ SOLN
120.0000 mg | Freq: Once | SUBCUTANEOUS | Status: AC
  Administered 2016-02-23: 120 mg via SUBCUTANEOUS
  Filled 2016-02-23: qty 1.7

## 2016-02-23 MED ORDER — DIPHENHYDRAMINE HCL 50 MG/ML IJ SOLN
12.5000 mg | Freq: Once | INTRAMUSCULAR | Status: AC
Start: 2016-02-23 — End: 2016-02-23
  Administered 2016-02-23: 12.5 mg via INTRAVENOUS
  Filled 2016-02-23: qty 1

## 2016-02-23 MED ORDER — HEPARIN SOD (PORK) LOCK FLUSH 100 UNIT/ML IV SOLN
500.0000 [IU] | Freq: Once | INTRAVENOUS | Status: AC | PRN
  Administered 2016-02-23: 500 [IU]
  Filled 2016-02-23: qty 5

## 2016-02-23 MED ORDER — SODIUM CHLORIDE 0.9 % IV SOLN
Freq: Once | INTRAVENOUS | Status: AC
  Administered 2016-02-23: 12:00:00 via INTRAVENOUS
  Filled 2016-02-23: qty 1000

## 2016-02-23 MED ORDER — SODIUM CHLORIDE 0.9 % IV SOLN
1600.0000 mg | Freq: Once | INTRAVENOUS | Status: AC
  Administered 2016-02-23: 1600 mg via INTRAVENOUS
  Filled 2016-02-23: qty 36.82

## 2016-02-23 MED ORDER — DOCETAXEL CHEMO INJECTION 160 MG/16ML
30.0000 mg/m2 | Freq: Once | INTRAVENOUS | Status: AC
  Administered 2016-02-23: 60 mg via INTRAVENOUS
  Filled 2016-02-23: qty 6

## 2016-02-23 NOTE — Progress Notes (Signed)
[place] @ [facility] [phone]  Fax:(336) [place]: 06-04-1956  MR#: [id number]  PIR#:[id number]  Patient Care Team: [name], MD as PCP - General (Internal Medicine)  CHIEF COMPLAINT:  Chief Complaint  Patient presents with  . Lung Cancer   Adenocarcinoma of lung stage IV. cT4_N3_M1_ pT_N_M_ Stage Grouping:4 Cancer Status:   prior treatment. Started in December 2009 with carboplatinum and Alimta.  Last dose of carboplatinum was in March of 2010.  Last dose of maintenance Alimta was in June, 29 th  of 2011.chemotherapy discontinued because disease was stable for a long time and patient was getting extremely fatigued 2. Re- staging PET scan in September of 2012, shows progressive disease 3. Chemotherapy with carboplatinum Alimta was started in September of 2012 4. Patient has progressive disease based on PET scan in September of 2013 and will be started on carboplatinum and gemcitabine plus minus Avastin 5. VERISTRAT test is positive(good) 6. Carboplatinum reaction (October 17, 2012)chemotherapy was discontinued 7. Started on Tarceva (November, 2013)  8.Palliative radiation therapy  therapy to left supraclavicular area. 9.Tarceva was discontinued because of progressive disease by PET scan criteria.  November 2 014 10Progressive disease by PET scan.  Patient started on Taxotere and [facility] December 16, 2014 11.  Poor tolerance to chemotherapy so patient was started on NIVOLULAMAB 12.  Progressing disease on [place].  January 2 017 13.  Palliative radiation therapy to the spine (January, 2017) On XGEVA   For  bone metastases 14.  Patient had a guidance study done which did not reveal any targeted mutation.  Patient had some mild dictation notice in the ALK however her sequencing change does not altered them in no [place] the disposition and is unlikely to be therapeutic target.  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  HPI:      No history exists.    Oncology Flowsheet 10/05/2015 10/19/2015 11/02/2015 11/23/2015 12/07/2015 01/21/2016 02/23/2016  Day, Cycle - - - - - - Day 1, Cycle 1  denosumab (XGEVA) [place] - - - - - 120 mg -  dexamethasone (DECADRON) IV - - - - - - [ 10 mg ]  DOCEtaxel (TAXOTERE) IV - - - - - - 30 mg/m2  nivolumab (OPDIVO) IV 300 mg 240 mg 240 mg 240 mg 240 mg - -  ondansetron (ZOFRAN) IV - - - - - - [ 8 mg ]    INTERVAL HISTORY 60 year old lady with stage IV carcinoma of lung Patient smokes smoked heavily in last few days. Patient is here for further follow-up and treatment consideration.  Bony pains have improved.  No nausea no vomiting no diarrhea appetite has been improving. Patient still remains wheelchair confined.  Patient is using fentanyl patch here for further follow-up and treatment consideration   REVIEW OF SYSTEMS:   GENERAL:  Feels good.  Active.  No fevers, sweats or weight loss. PERFORMANCE STATUS (ECOG):1 HEENT:  No visual changes, runny nose, sore throat, mouth sores or tenderness. Lungs: No shortness of breath or cough.  No hemoptysis. Cardiac:  No chest pain, palpitations, orthopnea, or PND. GI:  No nausea, vomiting, diarrhea, constipation, melena or hematochezia. GU:  No urgency, frequency, dysuria, or hematuria. Musculoskeletal:  Low back pain and pain radiating down to right hip Extremities:  No pain or swelling. Skin:  No rashes or skin changes. Neuro:  No headache, numbness or weakness, balance or coordination issues. Endocrine:  No diabetes, thyroid issues, hot  flashes or night sweats. Psych:  No mood changes, depression or anxiety. Pain:  Right lower quadrant pain.  Right upper chest wall pain.  Does not require any pain medication takes  Occasional  Tylenol Review of systems:  All other systems reviewed and found to be negative.  As per HPI. Otherwise, a complete review of systems is negatve.  PAST MEDICAL HISTORY: Past Medical History    Diagnosis Date  . Cancer of lung ([place]) 05/07/2015   Diabetes. COPD on oxygen PAST SURGICAL HISTORY: Has been reviewed from previous notes FAMILY HISTORY No family history of lung cancer for colon cancer, ovarian cancer     ADVANCED DIRECTIVES: Patient does not have any advanced healthcare directive. Information has been given.  HEALTH MAINTENANCE: Social History  Substance Use Topics  . Smoking status: Current Every Day Smoker  . Smokeless tobacco: None  . Alcohol Use: None      Allergies  Allergen Reactions  . Codeine Nausea And Vomiting    Current Outpatient Prescriptions  Medication Sig Dispense Refill  . albuterol (PROVENTIL HFA;VENTOLIN HFA) 108 (90 BASE) MCG/ACT inhaler Inhale 1-2 puffs into the lungs every 4 (four) hours as needed for wheezing or shortness of breath. 1 Inhaler 3  . ALPRAZolam (XANAX) 0.25 MG tablet Take 1 tablet (0.25 mg total) by mouth 2 (two) times daily as needed for anxiety. 60 tablet 1  . citalopram (CELEXA) 20 MG tablet Take by mouth.    . fentaNYL (DURAGESIC - DOSED MCG/HR) 12 MCG/HR Place 1 patch (12.5 mcg total) onto the skin every 3 (three) days. 10 patch 0  . fluticasone-salmeterol (ADVAIR HFA) 115-21 MCG/ACT inhaler Inhale into the lungs.    . gabapentin (NEURONTIN) 300 MG capsule Take by mouth.    . hydrochlorothiazide (HYDRODIURIL) 25 MG tablet Take by mouth. Reported on 02/04/2016    . insulin lispro (HUMALOG) 100 UNIT/ML injection Inject into the skin 3 (three) times daily before meals.    . Insulin NPH, Human,, Isophane, (HUMULIN N) 100 UNIT/ML Kiwkpen Reported on 02/04/2016    . potassium chloride SA (K-DUR,KLOR-CON) 20 MEQ tablet Take 1 tablet (20 mEq total) by mouth daily. 30 tablet 0  . quinapril (ACCUPRIL) 40 MG tablet Take by mouth.    . traMADol (ULTRAM) 50 MG tablet Take 1 tablet (50 mg total) by mouth every 6 (six) hours as needed. 60 tablet 0   No current facility-administered medications for this visit.    Facility-Administered Medications Ordered in Other Visits  Medication Dose Route Frequency Provider Last Rate Last Dose  . denosumab (XGEVA) injection 120 mg  120 mg Subcutaneous Once Forest Gleason, MD      . DOCEtaxel (TAXOTERE) 60 mg in dextrose 5 % 150 mL chemo infusion  30 mg/m2 (Order-Specific) Intravenous Once Forest Gleason, MD 156 mL/hr at 02/23/16 1246 60 mg at 02/23/16 1246  . gemcitabine (GEMZAR) 1,600 mg in sodium chloride 0.9 % 100 mL chemo infusion  1,600 mg Intravenous Once Forest Gleason, MD      . heparin lock flush 100 unit/mL  500 Units Intracatheter Once PRN Forest Gleason, MD      . sodium chloride 0.9 % injection 10 mL  10 mL Intracatheter PRN Forest Gleason, MD      . sodium chloride flush (NS) 0.9 % injection 10 mL  10 mL Intracatheter PRN Forest Gleason, MD        OBJECTIVE:  Filed Vitals:   02/23/16 1010  BP: 135/79  Pulse: 105  Temp: 97.2 F (  36.2 C)  Resp: 18     Body mass index is 37.44 kg/(m^2).    ECOG FS:1 - Symptomatic but completely ambulatory  PHYSICAL EXAM Gen. status: Patient is alert oriented not in acute distress on oxygen . Moderately obese lady on oxygen Lungs: COPD.  Emphysematous chest.  Oxygen dependent. Bilateral rhonchi. Cardiac  Tachycardia. Musculoskeletal system tenderness in the right ankle with swelling Abdomen: Soft.  No ascites.  Liver spleen not palpable Neurological system: Higher functions within normal limit.  No focal sign Lower extremity no edema Skin: No rash Lymphatic system: Palpable left supraclavicular node decreased in size Psychiatric system: Patient is less anxious LAB RESULTS:  Appointment on 02/23/2016  Component Date Value Ref Range Status  . Sodium 02/23/2016 134* 135 - 145 mmol/L Final  . Potassium 02/23/2016 3.9  3.5 - 5.1 mmol/L Final  . Chloride 02/23/2016 99* 101 - 111 mmol/L Final  . CO2 02/23/2016 32  22 - 32 mmol/L Final  . Glucose, Bld 02/23/2016 135* 65 - 99 mg/dL Final  . BUN 02/23/2016 12  6 - 20  mg/dL Final  . Creatinine, Ser 02/23/2016 1.28* 0.44 - 1.00 mg/dL Final  . Calcium 02/23/2016 8.4* 8.9 - 10.3 mg/dL Final  . Total Protein 02/23/2016 6.8  6.5 - 8.1 g/dL Final  . Albumin 02/23/2016 3.3* 3.5 - 5.0 g/dL Final  . AST 02/23/2016 14* 15 - 41 U/L Final  . ALT 02/23/2016 14  14 - 54 U/L Final  . Alkaline Phosphatase 02/23/2016 100  38 - 126 U/L Final  . Total Bilirubin 02/23/2016 0.6  0.3 - 1.2 mg/dL Final  . GFR calc non Af Amer 02/23/2016 45* >60 mL/min Final  . GFR calc Af Amer 02/23/2016 52* >60 mL/min Final   Comment: (NOTE) The eGFR has been calculated using the CKD EPI equation. This calculation has not been validated in all clinical situations. eGFR's persistently <60 mL/min signify possible Chronic Kidney Disease.   . Anion gap 02/23/2016 3* 5 - 15 Final  . Magnesium 02/23/2016 2.0  1.7 - 2.4 mg/dL Final  . WBC 02/23/2016 5.8  3.6 - 11.0 K/uL Final  . RBC 02/23/2016 5.03  3.80 - 5.20 MIL/uL Final  . Hemoglobin 02/23/2016 14.7  12.0 - 16.0 g/dL Final  . HCT 02/23/2016 45.5  35.0 - 47.0 % Final  . MCV 02/23/2016 90.4  80.0 - 100.0 fL Final  . MCH 02/23/2016 29.2  26.0 - 34.0 pg Final  . MCHC 02/23/2016 32.3  32.0 - 36.0 g/dL Final  . RDW 02/23/2016 18.0* 11.5 - 14.5 % Final  . Platelets 02/23/2016 185  150 - 440 K/uL Final  . Neutrophils Relative % 02/23/2016 79   Final  . Neutro Abs 02/23/2016 4.5  1.4 - 6.5 K/uL Final  . Lymphocytes Relative 02/23/2016 11   Final  . Lymphs Abs 02/23/2016 0.6* 1.0 - 3.6 K/uL Final  . Monocytes Relative 02/23/2016 7   Final  . Monocytes Absolute 02/23/2016 0.4  0.2 - 0.9 K/uL Final  . Eosinophils Relative 02/23/2016 2   Final  . Eosinophils Absolute 02/23/2016 0.1  0 - 0.7 K/uL Final  . Basophils Relative 02/23/2016 1   Final  . Basophils Absolute 02/23/2016 0.1  0 - 0.1 K/uL Final   December 30, 2015 (PET scan) There is new focal hypermetabolism in the right clavicular head with an SUV max of 8.8, new. A lytic lesion in the  right L5 vertebral body measures 3.0 cm, new.  IMPRESSION: 1. Progressive  stage IV lung cancer as evidenced by worsening mediastinal and bi hilar adenopathy, enlarging bilateral pulmonary nodules and new osseous metastatic disease. 2. Three-vessel coronary artery calcification. 3. Left adrenal adenoma.   ASSESSMENT: Stage IV carcinoma of lung Obesity BMI is 37 Chronic smoker Diabetes On oxygen 2, low back pain has been improving after radiation was started\ Guidant report has been reviewed independently.  Total duration of visit was 61mnutes.  50% or more time was spent in counseling patient and family regarding prognosis and options of treatment and available resourcesNo targeted mutation has been found so far  Patient's sister was present who is a n[employer] scientist All other options including palliative care has been discussed. In his pain is well controlled she does not want to take hydrocodone so tramadol in combination with Tylenol has been recommended.  Possibility of chemotherapy with limited advantage with gemcitabine and Taxotere and has been discuss in follow-up consent has been off pain  Intent of chemotherapy is palliation and relief in symptoms and extending survivald.  Consent has been obtained JForest Gleason MD   02/23/2016 1:14 PM

## 2016-02-29 ENCOUNTER — Ambulatory Visit: Payer: Medicare Other | Admitting: Radiation Oncology

## 2016-02-29 ENCOUNTER — Inpatient Hospital Stay: Admission: RE | Admit: 2016-02-29 | Payer: Medicare Other | Source: Ambulatory Visit | Admitting: Radiation Oncology

## 2016-02-29 ENCOUNTER — Telehealth: Payer: Self-pay | Admitting: *Deleted

## 2016-02-29 MED ORDER — FLUCONAZOLE 100 MG PO TABS: 100.0000 mg | ORAL_TABLET | Freq: Every day | ORAL | Status: AC

## 2016-02-29 NOTE — Telephone Encounter (Signed)
Requesting med for yeast oral and under both breasts

## 2016-02-29 NOTE — Telephone Encounter (Signed)
Notified that Diflucan e scribed

## 2016-03-08 ENCOUNTER — Inpatient Hospital Stay (HOSPITAL_BASED_OUTPATIENT_CLINIC_OR_DEPARTMENT_OTHER): Payer: Medicare Other | Admitting: Oncology

## 2016-03-08 ENCOUNTER — Encounter: Payer: Self-pay | Admitting: Oncology

## 2016-03-08 ENCOUNTER — Inpatient Hospital Stay: Payer: Medicare Other | Attending: Oncology

## 2016-03-08 ENCOUNTER — Inpatient Hospital Stay: Payer: Medicare Other

## 2016-03-08 VITALS — BP 119/88 | HR 156 | Temp 96.8°F | Resp 18

## 2016-03-08 DIAGNOSIS — Z993 Dependence on wheelchair: Secondary | ICD-10-CM | POA: Insufficient documentation

## 2016-03-08 DIAGNOSIS — E119 Type 2 diabetes mellitus without complications: Secondary | ICD-10-CM | POA: Diagnosis not present

## 2016-03-08 DIAGNOSIS — Z6837 Body mass index (BMI) 37.0-37.9, adult: Secondary | ICD-10-CM | POA: Diagnosis not present

## 2016-03-08 DIAGNOSIS — C3412 Malignant neoplasm of upper lobe, left bronchus or lung: Secondary | ICD-10-CM | POA: Diagnosis present

## 2016-03-08 DIAGNOSIS — Z794 Long term (current) use of insulin: Secondary | ICD-10-CM | POA: Diagnosis not present

## 2016-03-08 DIAGNOSIS — E669 Obesity, unspecified: Secondary | ICD-10-CM | POA: Insufficient documentation

## 2016-03-08 DIAGNOSIS — Z79899 Other long term (current) drug therapy: Secondary | ICD-10-CM

## 2016-03-08 DIAGNOSIS — Z9221 Personal history of antineoplastic chemotherapy: Secondary | ICD-10-CM | POA: Diagnosis not present

## 2016-03-08 DIAGNOSIS — Z9981 Dependence on supplemental oxygen: Secondary | ICD-10-CM | POA: Diagnosis not present

## 2016-03-08 DIAGNOSIS — J449 Chronic obstructive pulmonary disease, unspecified: Secondary | ICD-10-CM | POA: Insufficient documentation

## 2016-03-08 DIAGNOSIS — F17210 Nicotine dependence, cigarettes, uncomplicated: Secondary | ICD-10-CM | POA: Insufficient documentation

## 2016-03-08 DIAGNOSIS — C7951 Secondary malignant neoplasm of bone: Secondary | ICD-10-CM

## 2016-03-08 LAB — CBC WITH DIFFERENTIAL/PLATELET
BASOS ABS: 0 10*3/uL (ref 0–0.1)
Basophils Relative: 1 %
EOS ABS: 0 10*3/uL (ref 0–0.7)
HEMATOCRIT: 44.4 % (ref 35.0–47.0)
Hemoglobin: 14.1 g/dL (ref 12.0–16.0)
LYMPHS ABS: 0.4 10*3/uL — AB (ref 1.0–3.6)
MCH: 29.2 pg (ref 26.0–34.0)
MCHC: 31.8 g/dL — ABNORMAL LOW (ref 32.0–36.0)
MCV: 91.7 fL (ref 80.0–100.0)
Monocytes Absolute: 0.5 10*3/uL (ref 0.2–0.9)
Monocytes Relative: 12 %
Neutro Abs: 3.2 10*3/uL (ref 1.4–6.5)
Platelets: 160 10*3/uL (ref 150–440)
RBC: 4.85 MIL/uL (ref 3.80–5.20)
RDW: 18.5 % — ABNORMAL HIGH (ref 11.5–14.5)
WBC: 4.2 10*3/uL (ref 3.6–11.0)

## 2016-03-08 LAB — COMPREHENSIVE METABOLIC PANEL
ALK PHOS: 98 U/L (ref 38–126)
ALT: 22 U/L (ref 14–54)
AST: 30 U/L (ref 15–41)
Albumin: 3 g/dL — ABNORMAL LOW (ref 3.5–5.0)
Anion gap: 6 (ref 5–15)
BILIRUBIN TOTAL: 1 mg/dL (ref 0.3–1.2)
BUN: 18 mg/dL (ref 6–20)
CO2: 31 mmol/L (ref 22–32)
CREATININE: 1.56 mg/dL — AB (ref 0.44–1.00)
Calcium: 8 mg/dL — ABNORMAL LOW (ref 8.9–10.3)
Chloride: 96 mmol/L — ABNORMAL LOW (ref 101–111)
GFR calc Af Amer: 41 mL/min — ABNORMAL LOW (ref >60)
GFR, EST NON AFRICAN AMERICAN: 35 mL/min — AB (ref >60)
GLUCOSE: 284 mg/dL — AB (ref 65–99)
Potassium: 4.1 mmol/L (ref 3.5–5.1)
Sodium: 133 mmol/L — ABNORMAL LOW (ref 135–145)
TOTAL PROTEIN: 6.5 g/dL (ref 6.5–8.1)

## 2016-03-08 LAB — MAGNESIUM: MAGNESIUM: 1.8 mg/dL (ref 1.7–2.4)

## 2016-03-08 NOTE — Progress Notes (Signed)
Patient sleeping a lot.  Decreased appetite.  No energy.  Blood diarrhea. N/V - Patient did not take BP meds this am.  HR 156

## 2016-03-12 ENCOUNTER — Encounter: Payer: Self-pay | Admitting: Oncology

## 2016-03-12 NOTE — Progress Notes (Signed)
[place] @ [facility] [phone]  Fax:([phone]) [place]: Feb 19, 1956  MR#: [id number]  PPI#:[id number]  Patient Care Team: [name], MD as PCP - General (Internal Medicine)  CHIEF COMPLAINT:  Chief Complaint  Patient presents with  . Lung Cancer   Adenocarcinoma of lung stage IV. cT4_N3_M1_ pT_N_M_ Stage Grouping:4 Cancer Status:   prior treatment. Started in December 2009 with carboplatinum and Alimta.  Last dose of carboplatinum was in March of 2010.  Last dose of maintenance Alimta was in June, 29 th  of 2011.chemotherapy discontinued because disease was stable for a long time and patient was getting extremely fatigued 2. Re- staging PET scan in September of 2012, shows progressive disease 3. Chemotherapy with carboplatinum Alimta was started in September of 2012 4. Patient has progressive disease based on PET scan in September of 2013 and will be started on carboplatinum and gemcitabine plus minus Avastin 5. VERISTRAT test is positive(good) 6. Carboplatinum reaction (October 17, 2012)chemotherapy was discontinued 7. Started on Tarceva (November, 2013)  8.Palliative radiation therapy  therapy to left supraclavicular area. 9.Tarceva was discontinued because of progressive disease by PET scan criteria.  November 2 014 10Progressive disease by PET scan.  Patient started on Taxotere and [facility] December 16, 2014 11.  Poor tolerance to chemotherapy so patient was started on NIVOLULAMAB 12.  Progressing disease on [place].  January 2 017 13.  Palliative radiation therapy to the spine (January, 2017) On XGEVA   For  bone metastases 14.  Patient had a guidance study done which did not reveal any targeted mutation.  Patient had some mild dictation notice in the ALK however her sequencing change does not altered them in no [place] the disposition and is unlikely to be therapeutic target.  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  HPI:      No history exists.    Oncology Flowsheet 10/05/2015 10/19/2015 11/02/2015 11/23/2015 12/07/2015 01/21/2016 02/23/2016  Day, Cycle - - - - - - Day 1, Cycle 1  denosumab (XGEVA) [place] - - - - - 120 mg 120 mg  dexamethasone (DECADRON) IV - - - - - - [ 10 mg ]  DOCEtaxel (TAXOTERE) IV - - - - - - 30 mg/m2  gemcitabine (GEMZAR) IV - - - - - - 1,600 mg  nivolumab (OPDIVO) IV 300 mg 240 mg 240 mg 240 mg 240 mg - -  ondansetron (ZOFRAN) IV - - - - - - [ 8 mg ]    INTERVAL HISTORY 60 year old lady with stage IV carcinoma of lung Patient smokes smoked heavily in last few days. Patient is here for further follow-up and treatment consideration.  Bony pains have improved.  No nausea no vomiting no diarrhea appetite has been improving. Patient still remains wheelchair confined.  Patient is using fentanyl patch here for further follow-up and treatment consideration She  is here for ongoing evaluation and treatment consideration.  No bony pains.  Appetite has been stable.In general condition has been declining.  The patient was only given a schedule for chemotherapy but has number of questions regarding that.  Accompanied with her sister who is in regards  REVIEW OF SYSTEMS:   GENERAL:  Feels good.  Active.  No fevers, sweats or weight loss. PERFORMANCE STATUS (ECOG):1 HEENT:  No visual changes, runny nose, sore throat, mouth sores or tenderness. Lungs: No shortness of breath or cough.  No hemoptysis. Cardiac:  No chest pain, palpitations, orthopnea, or  PND. GI:  No nausea, vomiting, diarrhea, constipation, melena or hematochezia. GU:  No urgency, frequency, dysuria, or hematuria. Musculoskeletal:  Low back pain and pain radiating down to right hip Extremities:  No pain or swelling. Skin:  No rashes or skin changes. Neuro:  No headache, numbness or weakness, balance or coordination issues. Endocrine:  No diabetes, thyroid issues, hot flashes or night sweats. Psych:  No mood  changes, depression or anxiety. Pain:  Right lower quadrant pain.  Right upper chest wall pain.  Does not require any pain medication takes  Occasional  Tylenol Review of systems:  All other systems reviewed and found to be negative.  As per HPI. Otherwise, a complete review of systems is negatve.  PAST MEDICAL HISTORY: Past Medical History  Diagnosis Date  . Cancer of lung ([place]) 05/07/2015   Diabetes. COPD on oxygen PAST SURGICAL HISTORY: Has been reviewed from previous notes FAMILY HISTORY No family history of lung cancer for colon cancer, ovarian cancer     ADVANCED DIRECTIVES: Patient does not have any advanced healthcare directive. Information has been given.  HEALTH MAINTENANCE: Social History  Substance Use Topics  . Smoking status: Current Every Day Smoker  . Smokeless tobacco: None  . Alcohol Use: None      Allergies  Allergen Reactions  . Codeine Nausea And Vomiting    Current Outpatient Prescriptions  Medication Sig Dispense Refill  . albuterol (PROVENTIL HFA;VENTOLIN HFA) 108 (90 BASE) MCG/ACT inhaler Inhale 1-2 puffs into the lungs every 4 (four) hours as needed for wheezing or shortness of breath. 1 Inhaler 3  . ALPRAZolam (XANAX) 0.25 MG tablet Take 1 tablet (0.25 mg total) by mouth 2 (two) times daily as needed for anxiety. 60 tablet 1  . citalopram (CELEXA) 20 MG tablet Take by mouth.    . fentaNYL (DURAGESIC - DOSED MCG/HR) 12 MCG/HR Place 1 patch (12.5 mcg total) onto the skin every 3 (three) days. 10 patch 0  . fluconazole (DIFLUCAN) 100 MG tablet Take 1 tablet (100 mg total) by mouth daily. 7 tablet 0  . fluticasone-salmeterol (ADVAIR HFA) 115-21 MCG/ACT inhaler Inhale into the lungs.    . gabapentin (NEURONTIN) 300 MG capsule Take by mouth.    . hydrochlorothiazide (HYDRODIURIL) 25 MG tablet Take by mouth. Reported on 02/04/2016    . insulin lispro (HUMALOG) 100 UNIT/ML injection Inject into the skin 3 (three) times daily before meals.    . Insulin  NPH, Human,, Isophane, (HUMULIN N) 100 UNIT/ML Kiwkpen Reported on 02/04/2016    . potassium chloride SA (K-DUR,KLOR-CON) 20 MEQ tablet Take 1 tablet (20 mEq total) by mouth daily. 30 tablet 0  . quinapril (ACCUPRIL) 40 MG tablet Take by mouth.    . traMADol (ULTRAM) 50 MG tablet Take 1 tablet (50 mg total) by mouth every 6 (six) hours as needed. 60 tablet 0   No current facility-administered medications for this visit.   Facility-Administered Medications Ordered in Other Visits  Medication Dose Route Frequency Provider Last Rate Last Dose  . sodium chloride 0.9 % injection 10 mL  10 mL Intracatheter PRN Forest Gleason, MD        OBJECTIVE:  Filed Vitals:   03/08/16 1108  BP: 119/88  Pulse: 156  Temp: 96.8 F (36 C)  Resp: 18     There is no weight on file to calculate BMI.    ECOG FS:2 - Symptomatic, <50% confined to bed  PHYSICAL EXAM Gen. status: Patient is alert oriented not in acute distress  on oxygen . Moderately obese lady on oxygen Lungs: COPD.  Emphysematous chest.  Oxygen dependent. Bilateral rhonchi. Cardiac  Tachycardia. Musculoskeletal system tenderness in the right ankle with swelling Abdomen: Soft.  No ascites.  Liver spleen not palpable Neurological system: Higher functions within normal limit.  No focal sign Lower extremity no edema Skin: No rash Lymphatic system: Palpable left supraclavicular node decreased in size Psychiatric system: Patient is less anxious LAB RESULTS:  Appointment on 03/08/2016  Component Date Value Ref Range Status  . WBC 03/08/2016 4.2  3.6 - 11.0 K/uL Final  . RBC 03/08/2016 4.85  3.80 - 5.20 MIL/uL Final  . Hemoglobin 03/08/2016 14.1  12.0 - 16.0 g/dL Final  . HCT 03/08/2016 44.4  35.0 - 47.0 % Final  . MCV 03/08/2016 91.7  80.0 - 100.0 fL Final  . MCH 03/08/2016 29.2  26.0 - 34.0 pg Final  . MCHC 03/08/2016 31.8* 32.0 - 36.0 g/dL Final  . RDW 03/08/2016 18.5* 11.5 - 14.5 % Final  . Platelets 03/08/2016 160  150 - 440 K/uL Final    . Neutrophils Relative % 03/08/2016 76%   Final  . Neutro Abs 03/08/2016 3.2  1.4 - 6.5 K/uL Final  . Lymphocytes Relative 03/08/2016 10%   Final  . Lymphs Abs 03/08/2016 0.4* 1.0 - 3.6 K/uL Final  . Monocytes Relative 03/08/2016 12%   Final  . Monocytes Absolute 03/08/2016 0.5  0.2 - 0.9 K/uL Final  . Eosinophils Relative 03/08/2016 1%   Final  . Eosinophils Absolute 03/08/2016 0.0  0 - 0.7 K/uL Final  . Basophils Relative 03/08/2016 1%   Final  . Basophils Absolute 03/08/2016 0.0  0 - 0.1 K/uL Final  . Smear Review 03/08/2016 RARE NRBCs   Corrected  . Sodium 03/08/2016 133* 135 - 145 mmol/L Final  . Potassium 03/08/2016 4.1  3.5 - 5.1 mmol/L Final  . Chloride 03/08/2016 96* 101 - 111 mmol/L Final  . CO2 03/08/2016 31  22 - 32 mmol/L Final  . Glucose, Bld 03/08/2016 284* 65 - 99 mg/dL Final  . BUN 03/08/2016 18  6 - 20 mg/dL Final  . Creatinine, Ser 03/08/2016 1.56* 0.44 - 1.00 mg/dL Final  . Calcium 03/08/2016 8.0* 8.9 - 10.3 mg/dL Final  . Total Protein 03/08/2016 6.5  6.5 - 8.1 g/dL Final  . Albumin 03/08/2016 3.0* 3.5 - 5.0 g/dL Final  . AST 03/08/2016 30  15 - 41 U/L Final  . ALT 03/08/2016 22  14 - 54 U/L Final  . Alkaline Phosphatase 03/08/2016 98  38 - 126 U/L Final  . Total Bilirubin 03/08/2016 1.0  0.3 - 1.2 mg/dL Final  . GFR calc non Af Amer 03/08/2016 35* >60 mL/min Final  . GFR calc Af Amer 03/08/2016 41* >60 mL/min Final   Comment: (NOTE) The eGFR has been calculated using the CKD EPI equation. This calculation has not been validated in all clinical situations. eGFR's persistently <60 mL/min signify possible Chronic Kidney Disease.   . Anion gap 03/08/2016 6  5 - 15 Final  . Magnesium 03/08/2016 1.8  1.7 - 2.4 mg/dL Final    ASSESSMENT: Stage IV carcinoma of lung Obesity BMI is 37 Chronic smoker Diabetes On oxygen   We had prolonged discussion with the patient then her sister regarding goals of therapy.  At present time place and general condition has  been stable.  She is asking questions regarding quality of life and side effects of chemotherapy affecting her quality of life.  After prolonged  discussion regarding various options of therapy including palliative therapy at this point in time patient does not want any further chemotherapy  and continue symptom management.   discussed possibility of hospice and palliative care patient is going to think about all the options  Chemotherapy has been discontinued.   Duration of visit is 25 minutes and 50% of time was spent discussing VARIOUS  options or PALLIATIVE care with other physicians social worker  Forest Gleason, MD   03/12/2016 12:18 PM

## 2016-03-14 ENCOUNTER — Telehealth: Payer: Self-pay | Admitting: *Deleted

## 2016-03-14 NOTE — Telephone Encounter (Signed)
Pt's daughter called to get letter for school for extension on school work. Per Nira Conn, may place in the mail or if does not receive in time will pick up at cancer center. Letter will be placed behind registration as well as being placed in the mail. Heather verbalized understanding.

## 2016-03-15 ENCOUNTER — Telehealth: Payer: Self-pay | Admitting: *Deleted

## 2016-03-15 NOTE — Telephone Encounter (Signed)
Pt's daughter came by to request a referral to hospice be made for patient. Informed Nira Conn that will send referral today. Nira Conn stated that pt has been declining and she has had to take off from work and school to care for her.

## 2016-03-28 ENCOUNTER — Telehealth: Payer: Self-pay | Admitting: *Deleted

## 2016-03-28 NOTE — Telephone Encounter (Signed)
Called to report that patient expired at 11:27 this morning

## 2016-04-14 ENCOUNTER — Ambulatory Visit: Payer: Medicare Other | Admitting: Oncology

## 2016-04-14 ENCOUNTER — Other Ambulatory Visit: Payer: Medicare Other

## 2016-04-25 DEATH — deceased

## 2016-07-12 IMAGING — MR MR HEAD WO/W CM
10 of 11 series · 39 of 48 positions shown · IV contrast (multihance)
Comparison: PET-CT 12/30/2015.  Brain MRI 08/19/2010 and 12/31/2008

CLINICAL DATA: 59-year-old female with lung cancer. Syncopal
episode and blurred vision. Subsequent encounter.

EXAM:
MRI HEAD WITHOUT AND WITH CONTRAST
TECHNIQUE: Multiplanar, multiecho pulse sequences of the brain and surrounding
structures were obtained without and with intravenous contrast.
CONTRAST:  20mL MULTIHANCE GADOBENATE DIMEGLUMINE 529 MG/ML IV SOLN

[Series 2: T1 · sagittal · 5.0mm · 0.45mm/px · 1 of 23 slices shown]
[im 1/23]
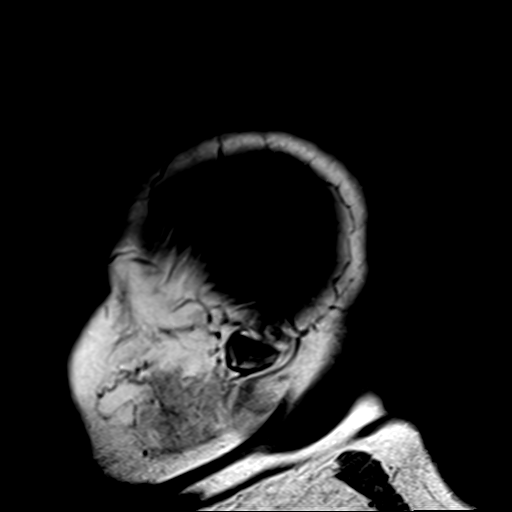

[Series 5: T2 · axial · 5.0mm · 0.60mm/px · z∈[-38,+118]mm · 3 of 25 slices shown (1 of 2)]
[im 1/25]
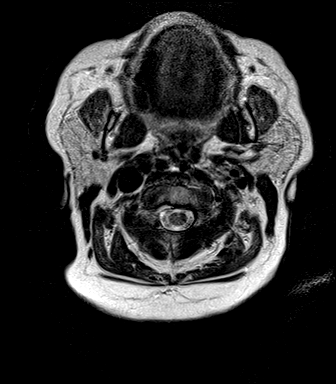
[im 13/25]
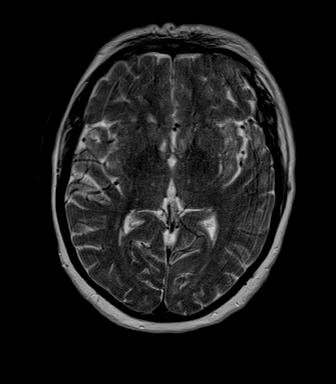
[im 25/25]
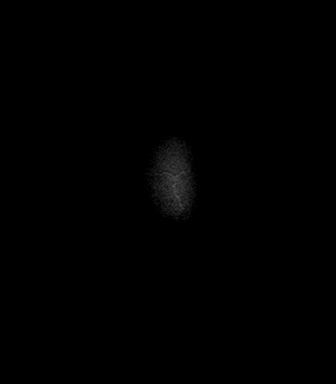

[Series 6: FLAIR · axial · 5.0mm · 0.45mm/px · z∈[-38,+118]mm · 3 of 25 slices shown]
[im 1/25]
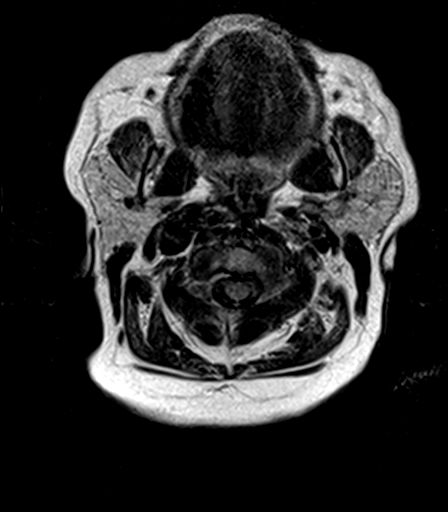
[im 13/25]
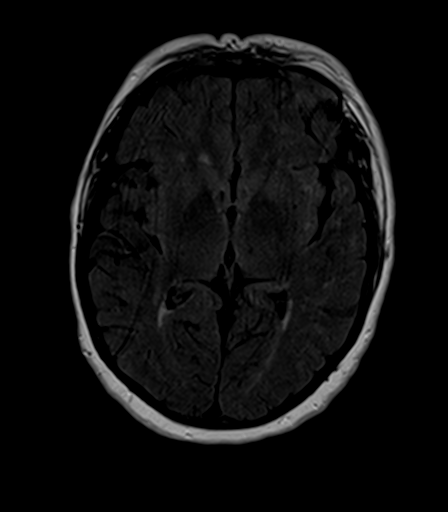
[im 25/25]
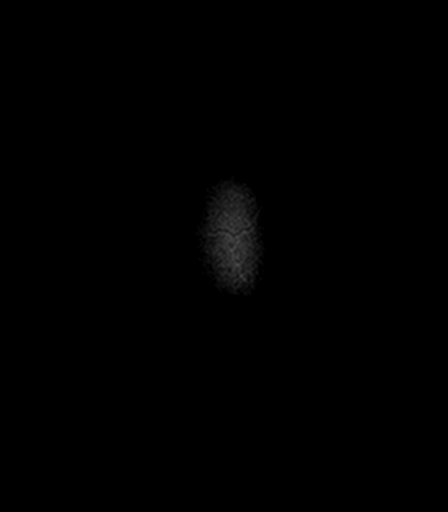

[Series 7: T2 · axial · 5.0mm · 0.45mm/px · z∈[-38,+118]mm · 3 of 25 slices shown (2 of 2)]
[im 1/25]
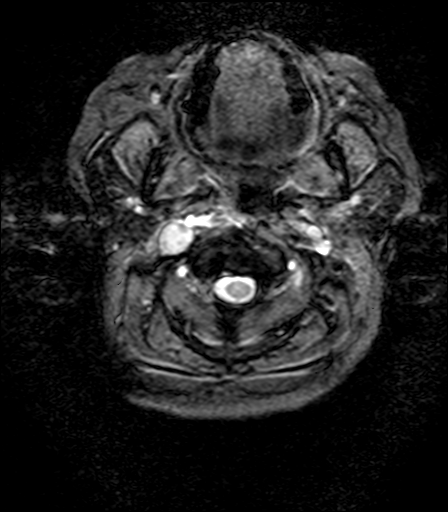
[im 13/25]
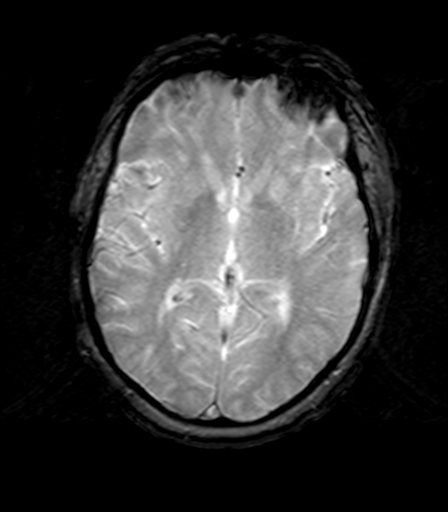
[im 25/25]
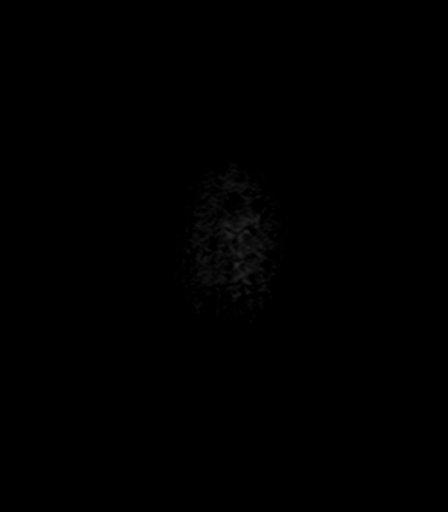

[Series 9: T2 post-contrast · coronal · 5.0mm · 0.49mm/px · 3 of 27 slices shown]
[im 1/27]
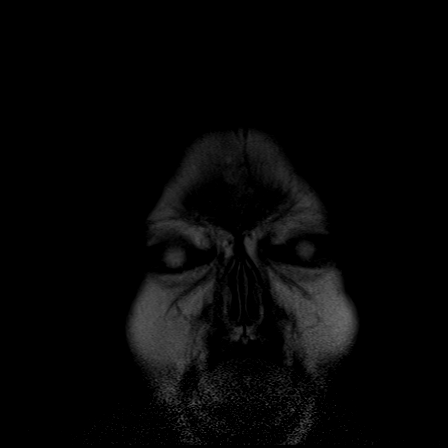
[im 14/27]
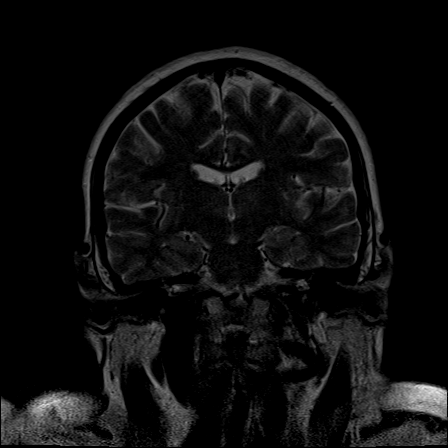
[im 27/27]
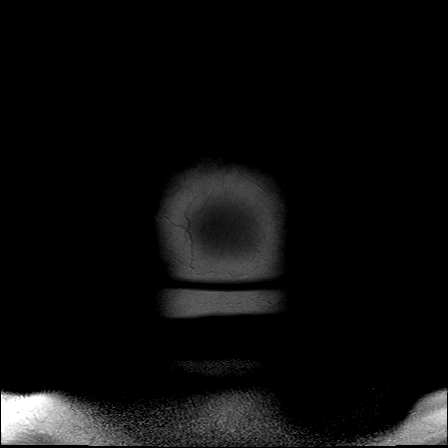

[Series 10: T1 post-contrast · axial · 3.0mm · 1.00mm/px · z∈[-37,+116]mm · 6 of 52 slices shown (1 of 3)]
[im 1/52]
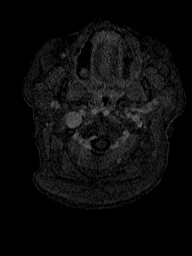
[im 11/52]
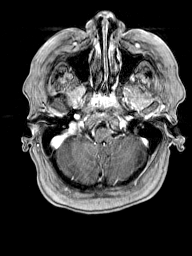
[im 21/52]
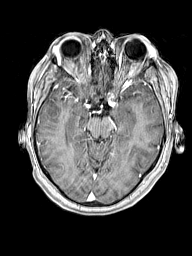
[im 31/52]
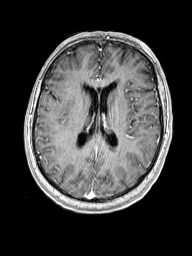
[im 41/52]
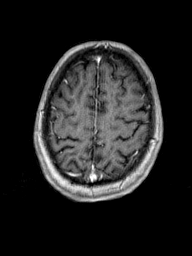
[im 52/52]
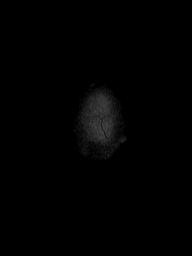

[Series 11: T1 post-contrast · coronal · 5.0mm · 0.43mm/px · 3 of 27 slices shown (2 of 3)]
[im 1/27]
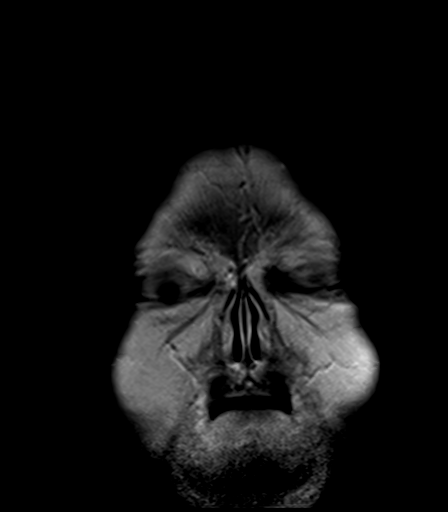
[im 14/27]
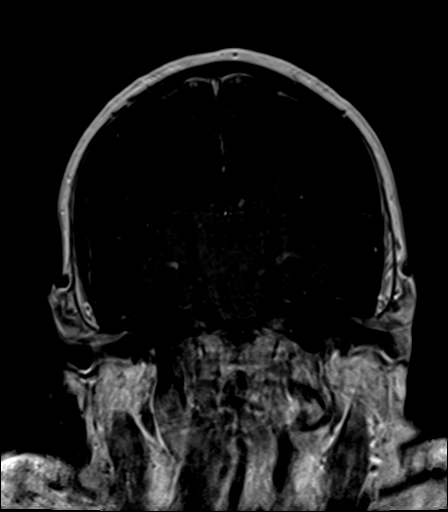
[im 27/27]
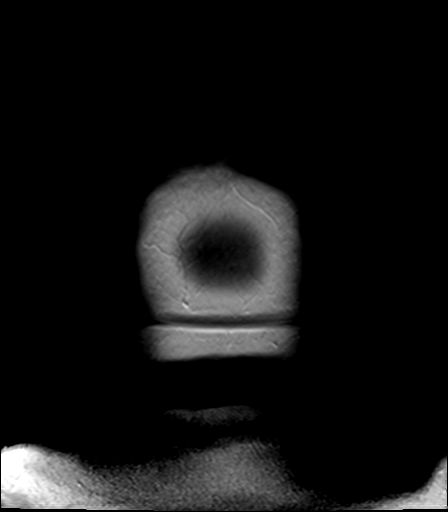

[Series 12: T1 post-contrast · sagittal · 5.0mm · 0.45mm/px · 3 of 23 slices shown (3 of 3)]
[im 1/23]
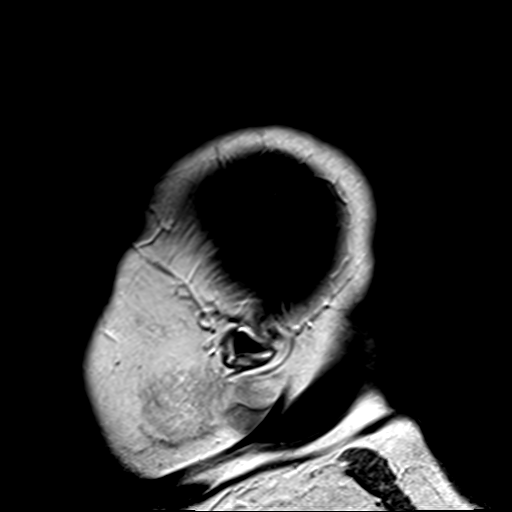
[im 12/23]
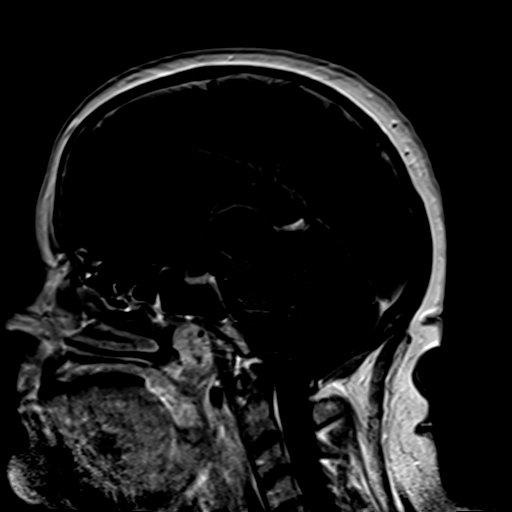
[im 23/23]
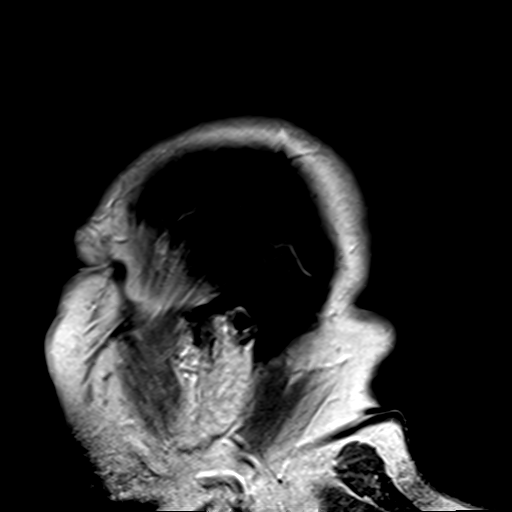

[Series 100: DWI · axial · 3.0mm · 1.80mm/px · z∈[-44,+118]mm · 7 of 55 slices shown]
[im 1/55]
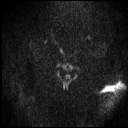
[im 10/55]
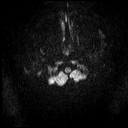
[im 19/55]
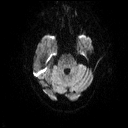
[im 28/55]
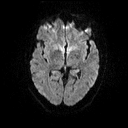
[im 37/55]
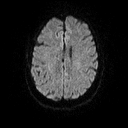
[im 46/55]
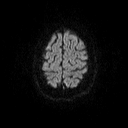
[im 55/55]
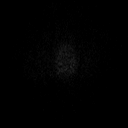

[Series 101: ADC · axial · 3.0mm · 1.80mm/px · z∈[-44,+118]mm · 7 of 55 slices shown]
[im 1/55]
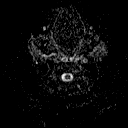
[im 10/55]
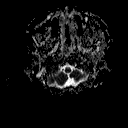
[im 19/55]
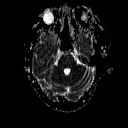
[im 28/55]
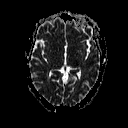
[im 37/55]
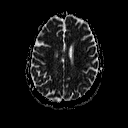
[im 46/55]
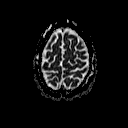
[im 55/55]
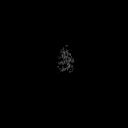

[39 of 48 positions shown; findings below may reference images not displayed]

FINDINGS: Mild motion artifact on post-contrast images. No abnormal
enhancement identified. No midline shift, mass effect, or evidence
of intracranial mass lesion. No dural thickening identified.

There is a small 7 mm focus of increased trace diffusion signal in
the right superior frontal gyrus (series 100, image 44) which is not
associated with any abnormal enhancement, and minimal if any
abnormal FLAIR signal (series 6, image 20). This also is not well
correlated on ADC. No other areas of abnormal diffusion. Major
intracranial vascular flow voids are stable.

No ventriculomegaly, extra-axial collection or acute intracranial
hemorrhage. Cervicomedullary junction and pituitary are within
normal limits. Negative visualized cervical spinal cord. Chronic
scattered nonspecific cerebral white matter T2 and FLAIR
hyperintensity, mostly subcortical, has not significantly changed.
Suggestion of a small chronic lacunar infarct in the ventral left
thalamus which is new. Brainstem and cerebellum within normal
limits. Visible internal auditory structures appear normal.

Increased mild right and new mild left mastoid effusions. Negative
nasopharynx, small retention cysts suspected. Mild paranasal sinus
mucosal thickening has not significantly changed. Postoperative
changes to the globes. Negative scalp soft tissues.

Decreased T1 bone marrow signal diffusely since 1922, but no
destructive osseous lesion identified.
IMPRESSION: 1. Subcentimeter nonenhancing abnormal signal in the right superior
frontal gyrus, favor small subacute lacunar infarct.
2. No metastatic disease identified to the brain. Consider short
interval follow-up study (e.g. 3 months) to confirm expected benign
evolution of the lesion in #1.
3. Diffuse bone marrow signal changes since 1922, favor treatment
related rather than widespread osseous metastatic disease.
4. Chronic age advanced white matter signal changes, most commonly
due to chronic small vessel disease.
5. Mild mastoid effusions, appear inconsequential.

## 2016-08-03 ENCOUNTER — Other Ambulatory Visit: Payer: Self-pay | Admitting: Nurse Practitioner
